# Patient Record
Sex: Female | Born: 1995 | Race: Black or African American | Hispanic: No | Marital: Single | Smoking: Never smoker
Health system: Southern US, Community
[De-identification: ages and names within clinical notes are randomized; demographics above are authoritative.]

## PROBLEM LIST (undated history)

## (undated) ENCOUNTER — Inpatient Hospital Stay (HOSPITAL_COMMUNITY): Payer: Self-pay

## (undated) DIAGNOSIS — Z2839 Other underimmunization status: Secondary | ICD-10-CM

## (undated) DIAGNOSIS — Z283 Underimmunization status: Secondary | ICD-10-CM

## (undated) DIAGNOSIS — O09899 Supervision of other high risk pregnancies, unspecified trimester: Secondary | ICD-10-CM

## (undated) HISTORY — PX: NO PAST SURGERIES: SHX2092

---
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
## 2015-12-04 ENCOUNTER — Emergency Department (HOSPITAL_COMMUNITY): Payer: Medicaid Other

## 2015-12-04 ENCOUNTER — Emergency Department (HOSPITAL_COMMUNITY)
Admission: EM | Admit: 2015-12-04 | Discharge: 2015-12-04 | Disposition: A | Discharge status: Home / Self Care | Payer: Medicaid Other | Attending: Emergency Medicine | Admitting: Emergency Medicine

## 2015-12-04 ENCOUNTER — Encounter (HOSPITAL_COMMUNITY): Payer: Self-pay | Admitting: *Deleted

## 2015-12-04 ENCOUNTER — Other Ambulatory Visit: Payer: Self-pay

## 2015-12-04 DIAGNOSIS — Z3A14 14 weeks gestation of pregnancy: Secondary | ICD-10-CM | POA: Insufficient documentation

## 2015-12-04 DIAGNOSIS — O99411 Diseases of the circulatory system complicating pregnancy, first trimester: Secondary | ICD-10-CM | POA: Diagnosis present

## 2015-12-04 DIAGNOSIS — R0789 Other chest pain: Secondary | ICD-10-CM | POA: Diagnosis not present

## 2015-12-04 DIAGNOSIS — O99511 Diseases of the respiratory system complicating pregnancy, first trimester: Secondary | ICD-10-CM | POA: Diagnosis not present

## 2015-12-04 DIAGNOSIS — J069 Acute upper respiratory infection, unspecified: Secondary | ICD-10-CM

## 2015-12-04 MED ORDER — AZITHROMYCIN 250 MG PO TABS: 250.0000 mg | ORAL_TABLET | Freq: Every day | ORAL | Status: DC

## 2015-12-04 MED ORDER — ALBUTEROL SULFATE HFA 108 (90 BASE) MCG/ACT IN AERS
2.0000 | INHALATION_SPRAY | RESPIRATORY_TRACT | Status: DC | PRN
  Administered 2015-12-04: 2 via RESPIRATORY_TRACT
  Filled 2015-12-04: qty 6.7

## 2015-12-04 NOTE — ED Notes (Signed)
Pt departed in NAD.  

## 2015-12-04 NOTE — Discharge Instructions (Signed)
Upper Respiratory Infection, Adult Most upper respiratory infections (URIs) are a viral infection of the air passages leading to the lungs. A URI affects the nose, throat, and upper air passages. The most common type of URI is nasopharyngitis and is typically referred to as "the common cold." URIs run their course and usually go away on their own. Most of the time, a URI does not require medical attention, but sometimes a bacterial infection in the upper airways can follow a viral infection. This is called a secondary infection. Sinus and middle ear infections are common types of secondary upper respiratory infections. Bacterial pneumonia can also complicate a URI. A URI can worsen asthma and chronic obstructive pulmonary disease (COPD). Sometimes, these complications can require emergency medical care and may be life threatening.  CAUSES Almost all URIs are caused by viruses. A virus is a type of germ and can spread from one person to another.  RISKS FACTORS You may be at risk for a URI if:   You smoke.   You have chronic heart or lung disease.  You have a weakened defense (immune) system.   You are very young or very old.   You have nasal allergies or asthma.  You work in crowded or poorly ventilated areas.  You work in health care facilities or schools. SIGNS AND SYMPTOMS  Symptoms typically develop 2-3 days after you come in contact with a cold virus. Most viral URIs last 7-10 days. However, viral URIs from the influenza virus (flu virus) can last 14-18 days and are typically more severe. Symptoms may include:   Runny or stuffy (congested) nose.   Sneezing.   Cough.   Sore throat.   Headache.   Fatigue.   Fever.   Loss of appetite.   Pain in your forehead, behind your eyes, and over your cheekbones (sinus pain).  Muscle aches.  DIAGNOSIS  Your health care provider may diagnose a URI by:  Physical exam.  Tests to check that your symptoms are not due to  another condition such as:  Strep throat.  Sinusitis.  Pneumonia.  Asthma. TREATMENT  A URI goes away on its own with time. It cannot be cured with medicines, but medicines may be prescribed or recommended to relieve symptoms. Medicines may help:  Reduce your fever.  Reduce your cough.  Relieve nasal congestion. HOME CARE INSTRUCTIONS   Take medicines only as directed by your health care provider.   Gargle warm saltwater or take cough drops to comfort your throat as directed by your health care provider.  Use a warm mist humidifier or inhale steam from a shower to increase air moisture. This may make it easier to breathe.  Drink enough fluid to keep your urine clear or pale yellow.   Eat soups and other clear broths and maintain good nutrition.   Rest as needed.   Return to work when your temperature has returned to normal or as your health care provider advises. You may need to stay home longer to avoid infecting others. You can also use a face mask and careful hand washing to prevent spread of the virus.  Increase the usage of your inhaler if you have asthma.   Do not use any tobacco products, including cigarettes, chewing tobacco, or electronic cigarettes. If you need help quitting, ask your health care provider. PREVENTION  The best way to protect yourself from getting a cold is to practice good hygiene.   Avoid oral or hand contact with people with cold   symptoms.   Wash your hands often if contact occurs.  There is no clear evidence that vitamin C, vitamin E, echinacea, or exercise reduces the chance of developing a cold. However, it is always recommended to get plenty of rest, exercise, and practice good nutrition.  SEEK MEDICAL CARE IF:   You are getting worse rather than better.   Your symptoms are not controlled by medicine.   You have chills.  You have worsening shortness of breath.  You have brown or red mucus.  You have yellow or brown nasal  discharge.  You have pain in your face, especially when you bend forward.  You have a fever.  You have swollen neck glands.  You have pain while swallowing.  You have white areas in the back of your throat. SEEK IMMEDIATE MEDICAL CARE IF:   You have severe or persistent:  Headache.  Ear pain.  Sinus pain.  Chest pain.  You have chronic lung disease and any of the following:  Wheezing.  Prolonged cough.  Coughing up blood.  A change in your usual mucus.  You have a stiff neck.  You have changes in your:  Vision.  Hearing.  Thinking.  Mood. MAKE SURE YOU:   Understand these instructions.  Will watch your condition.  Will get help right away if you are not doing well or get worse.   This information is not intended to replace advice given to you by your health care provider. Make sure you discuss any questions you have with your health care provider.   Document Released: 11/20/2000 Document Revised: 10/11/2014 Document Reviewed: 09/01/2013 Elsevier Interactive Patient Education 2016 Elsevier Inc.  

## 2015-12-04 NOTE — ED Notes (Signed)
The  Pt is c/o  A cold since this past Friday.  C/o chest pain when she coughs  Non-productive  lmp  March she is preg and is due dec 19th this is first baby

## 2015-12-04 NOTE — ED Provider Notes (Signed)
CSN: [id number]     Arrival date & time 12/04/15  0059 History   First MD Initiated Contact with Patient 12/04/15 0244     Chief Complaint  Patient presents with  . Chest Pain     (Consider location/radiation/quality/duration/timing/severity/associated sxs/prior Treatment) HPI Comments: Patient approximately 14 weeks into her first pregnancy presents with complaint of 7 days of cough, congestion, now with chest pain associated with cough. No known fever. She has tried taking Dayquil without relief. No abdominal pain, or vomiting.   The history is provided by the patient. No language interpreter was used.    History reviewed. No pertinent past medical history. History reviewed. No pertinent past surgical history. No family history on file. Social History  Substance Use Topics  . Smoking status: Never Smoker   . Smokeless tobacco: None  . Alcohol Use: No   OB History    Gravida Para Term Preterm AB TAB SAB Ectopic Multiple Living   1              Review of Systems  Constitutional: Negative for fever and chills.  HENT: Positive for congestion. Negative for sore throat.   Respiratory: Positive for cough. Negative for shortness of breath and wheezing.   Cardiovascular: Positive for chest pain.  Gastrointestinal: Negative.  Negative for vomiting and abdominal pain.  Musculoskeletal: Negative.  Negative for myalgias.  Skin: Negative.  Negative for rash.  Neurological: Negative.       Allergies  Review of patient's allergies indicates no known allergies.  Home Medications   Prior to Admission medications   Not on File   BP 124/73 mmHg  Pulse 61  Temp(Src) 98.5 F (36.9 C) (Oral)  Resp 17  Ht 5\' 9"  (1.753 m)  Wt 81.874 kg  BMI 26.64 kg/m2  SpO2 99%  LMP 09/03/2015 Physical Exam  Constitutional: She is oriented to person, place, and time. She appears well-developed and well-nourished.  HENT:  Head: Normocephalic.  Neck: Normal range of motion. Neck supple.   Cardiovascular: Normal rate and regular rhythm.   Pulmonary/Chest: Effort normal and breath sounds normal. She has no wheezes. She has no rales. She exhibits tenderness.  Actively coughing, congested but non-productive cough.  Abdominal: Soft. Bowel sounds are normal. There is no tenderness. There is no rebound and no guarding.  Musculoskeletal: Normal range of motion.  Neurological: She is alert and oriented to person, place, and time.  Skin: Skin is warm and dry. No rash noted.  Psychiatric: She has a normal mood and affect.    ED Course  Procedures (including critical care time) Labs Review Labs Reviewed - No data to display  Imaging Review Dg Chest 2 View  12/04/2015  CLINICAL DATA:  20 year old female with cold and cough EXAM: CHEST  2 VIEW COMPARISON:  None. FINDINGS: The heart size and mediastinal contours are within normal limits. Both lungs are clear. The visualized skeletal structures are unremarkable. IMPRESSION: No active cardiopulmonary disease. Electronically Signed   By: Elgie CollardArash  Radparvar M.D.   On: 12/04/2015 02:07   I have personally reviewed and evaluated these images and lab results as part of my medical decision-making.   EKG Interpretation None      MDM   Final diagnoses:  None    1. URI  Patient with one week symptoms of URI, cough getting worse now with chest pain that is c/w chest wall pain. Will cover with Z-pack, Albuterol inhaler for cough. Encouraged PCP follow up for further management if symptoms persist.  Elpidio AnisShari Tyreisha Ungar, PA-C 12/04/15 [phone]  Azalia BilisKevin Campos, MD 12/04/15 [phone]

## 2016-01-18 LAB — OB RESULTS CONSOLE HEPATITIS B SURFACE ANTIGEN: Hepatitis B Surface Ag: NEGATIVE

## 2016-01-18 LAB — OB RESULTS CONSOLE HIV ANTIBODY (ROUTINE TESTING): HIV: NONREACTIVE

## 2016-01-18 LAB — OB RESULTS CONSOLE GC/CHLAMYDIA
Chlamydia: NEGATIVE
GC PROBE AMP, GENITAL: NEGATIVE

## 2016-01-18 LAB — OB RESULTS CONSOLE RUBELLA ANTIBODY, IGM: Rubella: IMMUNE

## 2016-01-18 LAB — OB RESULTS CONSOLE RPR: RPR: NONREACTIVE

## 2016-01-18 LAB — OB RESULTS CONSOLE ABO/RH: RH Type: POSITIVE

## 2016-05-24 ENCOUNTER — Encounter (HOSPITAL_COMMUNITY): Payer: Self-pay | Admitting: *Deleted

## 2016-05-24 ENCOUNTER — Inpatient Hospital Stay (HOSPITAL_COMMUNITY): Payer: Medicaid Other

## 2016-05-24 ENCOUNTER — Inpatient Hospital Stay (HOSPITAL_COMMUNITY)
Admission: AD | Admit: 2016-05-24 | Discharge: 2016-05-24 | Disposition: A | Discharge status: Home / Self Care | Payer: Medicaid Other | Source: Ambulatory Visit | Attending: Obstetrics and Gynecology | Admitting: Obstetrics and Gynecology

## 2016-05-24 DIAGNOSIS — O368130 Decreased fetal movements, third trimester, not applicable or unspecified: Secondary | ICD-10-CM

## 2016-05-24 DIAGNOSIS — R030 Elevated blood-pressure reading, without diagnosis of hypertension: Secondary | ICD-10-CM | POA: Diagnosis not present

## 2016-05-24 DIAGNOSIS — Z3A34 34 weeks gestation of pregnancy: Secondary | ICD-10-CM | POA: Diagnosis not present

## 2016-05-24 DIAGNOSIS — O26893 Other specified pregnancy related conditions, third trimester: Secondary | ICD-10-CM | POA: Diagnosis not present

## 2016-05-24 DIAGNOSIS — O288 Other abnormal findings on antenatal screening of mother: Secondary | ICD-10-CM | POA: Insufficient documentation

## 2016-05-24 HISTORY — DX: Underimmunization status: Z28.3

## 2016-05-24 HISTORY — DX: Other underimmunization status: Z28.39

## 2016-05-24 HISTORY — DX: Supervision of other high risk pregnancies, unspecified trimester: O09.899

## 2016-05-24 LAB — COMPREHENSIVE METABOLIC PANEL
ALBUMIN: 2.9 g/dL — AB (ref 3.5–5.0)
ALT: 11 U/L — ABNORMAL LOW (ref 14–54)
ANION GAP: 8 (ref 5–15)
AST: 19 U/L (ref 15–41)
Alkaline Phosphatase: 98 U/L (ref 38–126)
BUN: 5 mg/dL — ABNORMAL LOW (ref 6–20)
CHLORIDE: 104 mmol/L (ref 101–111)
CO2: 23 mmol/L (ref 22–32)
Calcium: 8.9 mg/dL (ref 8.9–10.3)
Creatinine, Ser: 0.48 mg/dL (ref 0.44–1.00)
GFR calc Af Amer: >60 mL/min (ref >60)
GFR calc non Af Amer: >60 mL/min (ref >60)
GLUCOSE: 78 mg/dL (ref 65–99)
POTASSIUM: 3.5 mmol/L (ref 3.5–5.1)
SODIUM: 135 mmol/L (ref 135–145)
Total Bilirubin: 0.4 mg/dL (ref 0.3–1.2)
Total Protein: 7.3 g/dL (ref 6.5–8.1)

## 2016-05-24 LAB — PROTEIN / CREATININE RATIO, URINE
Creatinine, Urine: 28 mg/dL
Total Protein, Urine: <6 mg/dL

## 2016-05-24 LAB — CBC
HCT: 32.6 % — ABNORMAL LOW (ref 36.0–46.0)
Hemoglobin: 11.8 g/dL — ABNORMAL LOW (ref 12.0–15.0)
MCH: 28.1 pg (ref 26.0–34.0)
MCHC: 36.2 g/dL — AB (ref 30.0–36.0)
MCV: 77.6 fL — ABNORMAL LOW (ref 78.0–100.0)
Platelets: 236 10*3/uL (ref 150–400)
RBC: 4.2 MIL/uL (ref 3.87–5.11)
RDW: 14.2 % (ref 11.5–15.5)
WBC: 6.2 10*3/uL (ref 4.0–10.5)

## 2016-05-24 NOTE — Discharge Instructions (Signed)
Hypertension During Pregnancy °Hypertension, commonly called high blood pressure, is when the force of blood pumping through your arteries is too strong. Arteries are blood vessels that carry blood from the heart throughout the body. Hypertension during pregnancy can cause problems for you and your baby. Your baby may be born early (prematurely) or may not weigh as much as he or she should at birth. Very bad cases of hypertension during pregnancy can be life-threatening. °Different types of hypertension can occur during pregnancy. These include: °· Chronic hypertension. This happens when: °¨ You have hypertension before pregnancy and it continues during pregnancy. °¨ You develop hypertension before you are [redacted] weeks pregnant, and it continues during pregnancy. °· Gestational hypertension. This is hypertension that develops after the 20th week of pregnancy. °· Preeclampsia, also called toxemia of pregnancy. This is a very serious type of hypertension that develops only during pregnancy. It affects the whole body, and it can be very dangerous for you and your baby. °Gestational hypertension and preeclampsia usually go away within 6 weeks after your baby is born. Women who have hypertension during pregnancy have a greater chance of developing hypertension later in life or during future pregnancies. °What are the causes? °The exact cause of hypertension is not known. °What increases the risk? °There are certain factors that make it more likely for you to develop hypertension during pregnancy. These include: °· Having hypertension during a previous pregnancy or prior to pregnancy. °· Being overweight. °· Being older than age 40. °· Being pregnant for the first time or being pregnant with more than one baby. °· Becoming pregnant using fertilization methods such as IVF (in vitro fertilization). °· Having diabetes, kidney problems, or systemic lupus erythematosus. °· Having a family history of hypertension. °What are the  signs or symptoms? °Chronic hypertension and gestational hypertension rarely cause symptoms. Preeclampsia causes symptoms, which may include: °· Increased protein in your urine. Your health care provider will check for this at every visit before you give birth (prenatal visit). °· Severe headaches. °· Sudden weight gain. °· Swelling of the hands, face, legs, and feet. °· Nausea and vomiting. °· Vision problems, such as blurred or double vision. °· Numbness in the face, arms, legs, and feet. °· Dizziness. °· Slurred speech. °· Sensitivity to bright lights. °· Abdominal pain. °· Convulsions. °How is this diagnosed? °You may be diagnosed with hypertension during a routine prenatal exam. At each prenatal visit, you may: °· Have a urine test to check for high amounts of protein in your urine. °· Have your blood pressure checked. A blood pressure reading is recorded as two numbers, such as "120 over 80" (or 120/80). The first ("top") number is called the systolic pressure. It is a measure of the pressure in your arteries when your heart beats. The second ("bottom") number is called the diastolic pressure. It is a measure of the pressure in your arteries as your heart relaxes between beats. Blood pressure is measured in a unit called mm Hg. A normal blood pressure reading is: °¨ Systolic: below 120. °¨ Diastolic: below 80. °The type of hypertension that you are diagnosed with depends on your test results and when your symptoms developed. °· Chronic hypertension is usually diagnosed before 20 weeks of pregnancy. °· Gestational hypertension is usually diagnosed after 20 weeks of pregnancy. °· Hypertension with high amounts of protein in the urine is diagnosed as preeclampsia. °· Blood pressure measurements that stay above 160 systolic, or above 110 diastolic, are signs of severe preeclampsia. °  How is this treated? Treatment for hypertension during pregnancy varies depending on the type of hypertension you have and how  serious it is.  If you take medicines called ACE inhibitors to treat chronic hypertension, you may need to switch medicines. ACE inhibitors should not be taken during pregnancy.  If you have gestational hypertension, you may need to take blood pressure medicine.  If you are at risk for preeclampsia, your health care provider may recommend that you take a low-dose aspirin every day to prevent high blood pressure during your pregnancy.  If you have severe preeclampsia, you may need to be hospitalized so you and your baby can be monitored closely. You may also need to take medicine (magnesium sulfate) to prevent seizures and to lower blood pressure. This medicine may be given as an injection or through an IV tube.  In some cases, if your condition gets worse, you may need to deliver your baby early. Follow these instructions at home: Eating and drinking  Drink enough fluid to keep your urine clear or pale yellow.  Eat a healthy diet that is low in salt (sodium). Do not add salt to your food. Check food labels to see how much sodium a food or beverage contains. Lifestyle  Do not use any products that contain nicotine or tobacco, such as cigarettes and e-cigarettes. If you need help quitting, ask your health care provider.  Do not use alcohol.  Avoid caffeine.  Avoid stress as much as possible. Rest and get plenty of sleep. General instructions  Take over-the-counter and prescription medicines only as told by your health care provider.  While lying down, lie on your left side. This keeps pressure off your baby.  While sitting or lying down, raise (elevate) your feet. Try putting some pillows under your lower legs.  Exercise regularly. Ask your health care provider what kinds of exercise are best for you.  Keep all prenatal and follow-up visits as told by your health care provider. This is important. Contact a health care provider if:  You have symptoms that your health care provider  told you may require more treatment or monitoring, such as:  Fever.  Vomiting.  Headache. Get help right away if:  You have severe abdominal pain or vomiting that does not get better with treatment.  You suddenly develop swelling in your hands, ankles, or face.  You gain 4 lbs (1.8 kg) or more in 1 week.  You develop vaginal bleeding, or you have blood in your urine.  You do not feel your baby moving as much as usual.  You have blurred or double vision.  You have muscle twitching or sudden tightening (spasms).  You have shortness of breath.  Your lips or fingernails turn blue. This information is not intended to replace advice given to you by your health care provider. Make sure you discuss any questions you have with your health care provider. Document Released: 02/12/2011 Document Revised: 12/15/2015 Document Reviewed: 11/10/2015 Elsevier Interactive Patient Education  2017 Elsevier Inc.    Introduction Patient Name: ________________________________________________ Patient Due Date: ____________________ What is a fetal movement count? A fetal movement count is the number of times that you feel your baby move during a certain amount of time. This may also be called a fetal kick count. A fetal movement count is recommended for every pregnant woman. You may be asked to start counting fetal movements as early as week 28 of your pregnancy. Pay attention to when your baby is most active.  You may notice your baby's sleep and wake cycles. You may also notice things that make your baby move more. You should do a fetal movement count:  When your baby is normally most active.  At the same time each day. A good time to count movements is while you are resting, after having something to eat and drink. How do I count fetal movements? 1. Find a quiet, comfortable area. Sit, or lie down on your side. 2. Write down the date, the start time and stop time, and the number of movements that  you felt between those two times. Take this information with you to your health care visits. 3. For 2 hours, count kicks, flutters, swishes, rolls, and jabs. You should feel at least 10 movements during 2 hours. 4. You may stop counting after you have felt 10 movements. 5. If you do not feel 10 movements in 2 hours, have something to eat and drink. Then, keep resting and counting for 1 hour. If you feel at least 4 movements during that hour, you may stop counting. Contact a health care provider if:  You feel fewer than 4 movements in 2 hours.  Your baby is not moving like he or she usually does. Date: ____________ Start time: ____________ Stop time: ____________ Movements: ____________ Date: ____________ Start time: ____________ Stop time: ____________ Movements: ____________ Date: ____________ Start time: ____________ Stop time: ____________ Movements: ____________ Date: ____________ Start time: ____________ Stop time: ____________ Movements: ____________ Date: ____________ Start time: ____________ Stop time: ____________ Movements: ____________ Date: ____________ Start time: ____________ Stop time: ____________ Movements: ____________ Date: ____________ Start time: ____________ Stop time: ____________ Movements: ____________ Date: ____________ Start time: ____________ Stop time: ____________ Movements: ____________ Date: ____________ Start time: ____________ Stop time: ____________ Movements: ____________ This information is not intended to replace advice given to you by your health care provider. Make sure you discuss any questions you have with your health care provider. Document Released: 06/26/2006 Document Revised: 01/24/2016 Document Reviewed: 07/06/2015 Elsevier Interactive Patient Education  2017 [employer]

## 2016-05-24 NOTE — MAU Provider Note (Signed)
History     CSN: [id number]  Arrival date and time: 05/24/16 1341   First Provider Initiated Contact with Patient 05/24/16 1423      Chief Complaint  Patient presents with  . Decreased Fetal Movement   HPI Andrea Oneill is a 20 y.o. G1P0 at [id number] who [facility]. Patient went to office today with complaints of decreased fetal movement x 2 days. Reports feeling baby move while on the monitor in the office but none since then. Denies abdominal pain, abdominal trauma/fall, vaginal bleeding, or LOF.  Hx of elevated BPs in the office. Reports headache this morning that has since resolved without medication. Denies chest pain, epigastric pain, or n/v.   OB History    Gravida Para Term Preterm AB Living   1             SAB TAB Ectopic Multiple Live Births                  Past Medical History:  Diagnosis Date  . Maternal varicella, non-immune     Past Surgical History:  Procedure Laterality Date  . NO PAST SURGERIES      History reviewed. No pertinent family history.  Social History  Substance Use Topics  . Smoking status: Never Smoker  . Smokeless tobacco: Never Used  . Alcohol use No    Allergies: No Known Allergies  Prescriptions Prior to Admission  Medication Sig Dispense Refill Last Dose  . azithromycin (ZITHROMAX) 250 MG tablet Take 1 tablet (250 mg total) by mouth daily. Take first 2 tablets together, then 1 every day until finished. 6 tablet 0     Review of Systems  Constitutional: Negative.   Gastrointestinal: Negative.   Genitourinary: Negative.   Neurological: Positive for headaches (this AM; resolved).   Physical Exam   Blood pressure 135/84, pulse 88, temperature 99.2 F (37.3 C), resp. rate 18, last menstrual period 09/23/2015.  Temp:  [99.2 F (37.3 C)] 99.2 F (37.3 C) (12/15 1406) Pulse Rate:  [80-94] 80 (12/15 1653) Resp:  [18] 18 (12/15 1406) BP: (124-148)/(69-84) 135/75 (12/15 1653)  Physical Exam   Nursing note and vitals reviewed. Constitutional: She is oriented to person, place, and time. She appears well-developed and well-nourished. No distress.  HENT:  Head: Normocephalic and atraumatic.  Eyes: Conjunctivae are normal. Right eye exhibits no discharge. Left eye exhibits no discharge. No scleral icterus.  Neck: Normal range of motion.  Cardiovascular: Normal rate, regular rhythm and normal heart sounds.   No murmur heard. Respiratory: Effort normal and breath sounds normal. No respiratory distress. She has no wheezes.  GI: Soft. There is no tenderness.  Neurological: She is alert and oriented to person, place, and time. She has normal reflexes.  No clonus  Skin: Skin is warm and dry. She is not diaphoretic.  Psychiatric: She has a normal mood and affect. Her behavior is normal. Judgment and thought content normal.   Fetal Tracing:  Baseline: 135 Variability: moderate Accelerations: 15x15 Decelerations: none  Toco: none   MAU Course  Procedures Results for orders placed or performed during the hospital encounter of 05/24/16 (from the past 24 hour(s))  Protein / creatinine ratio, urine     Status: None   Collection Time: 05/24/16  1:58 PM  Result Value Ref Range   Creatinine, Urine 28.00 mg/dL   Total Protein, Urine <6 mg/dL   Protein Creatinine Ratio        0.00 - 0.15 mg/mg[Cre]  CBC     Status: Abnormal   Collection Time: 05/24/16  3:05 PM  Result Value Ref Range   WBC 6.2 4.0 - 10.5 K/uL   RBC 4.20 3.87 - 5.11 MIL/uL   Hemoglobin 11.8 (L) 12.0 - 15.0 g/dL   HCT 96.032.6 (L) 45.436.0 - 09.846.0 %   MCV 77.6 (L) 78.0 - 100.0 fL   MCH 28.1 26.0 - 34.0 pg   MCHC 36.2 (H) 30.0 - 36.0 g/dL   RDW 11.914.2 14.711.5 - 82.915.5 %   Platelets 236 150 - 400 K/uL  Comprehensive metabolic panel     Status: Abnormal   Collection Time: 05/24/16  3:05 PM  Result Value Ref Range   Sodium 135 135 - 145 mmol/L   Potassium 3.5 3.5 - 5.1 mmol/L   Chloride 104 101 - 111 mmol/L   CO2 23 22 - 32  mmol/L   Glucose, Bld 78 65 - 99 mg/dL   BUN 5 (L) 6 - 20 mg/dL   Creatinine, Ser 5.620.48 0.44 - 1.00 mg/dL   Calcium 8.9 8.9 - 13.010.3 mg/dL   Total Protein 7.3 6.5 - 8.1 g/dL   Albumin 2.9 (L) 3.5 - 5.0 g/dL   AST 19 15 - 41 U/L   ALT 11 (L) 14 - 54 U/L   Alkaline Phosphatase 98 38 - 126 U/L   Total Bilirubin 0.4 0.3 - 1.2 mg/dL   GFR calc non Af Amer >60 >60 mL/min   GFR calc Af Amer >60 >60 mL/min   Anion gap 8 5 - 15    MDM Reactive fetal tracing BPP 6/8, AFI 19 CBC, CMP, urine PCR Elevated BP x 2; none severe range Discussed pt with Dr. Vergie LivingPickens; Ok to discharge home & keep f/u in office as scheduled  Assessment and Plan  A: 1. Elevated BP without diagnosis of hypertension   2. Non-reactive NST (non-stress test)   3. [redacted] weeks gestation of pregnancy   4. Decreased fetal movement affecting management of pregnancy in third trimester, single or unspecified fetus    P: Discharge home Keep f/u with OB Discussed reasons to return to MAU including s/s pre eclampsia Fetal kick counts  Judeth HornErin Brianny Soulliere 05/24/2016, 2:23 PM

## 2016-05-24 NOTE — MAU Note (Signed)
Urine sent to lab 

## 2016-05-24 NOTE — MAU Note (Signed)
Pt sent from health dept for non reactive NST for prolonged monitoring

## 2016-05-30 LAB — OB RESULTS CONSOLE GBS: STREP GROUP B AG: NEGATIVE

## 2016-06-10 NOTE — L&D Delivery Note (Signed)
Delivery Note At 3:45 AM a viable female was delivered via Vaginal, Spontaneous Delivery (Presentation: vertex;OA).  APGAR: 7, 9; weight pending.   Placenta status: spontaneous,intact.  Cord: 3 vessel with the following complications: none.  Cord pH: not obtained.   Hemorrhage: Pitocin was started after baby's cord was clamped and cut. Upon delivery of placenta, patient's uterus felt boggy when massaged and she had a significant amount of bright red blood pouring out of vagina. Pitocin was increased. Patient continued to bleed with uterine massage. Postpartum hemorrhage protocol was initiated. Hemabate 250 mcg, Methergine 0.2 mg IM, as well as 800 mcg of buccal Cytotec was given. A 2nd IV line was started. Lomotil ordered. Maintenance IVF were started. Patient's vaginal bleeding began to subside. The uterus was checked for clots by Dr. Despina HiddenEure. Methergine 0.2 mg q4 hours was ordered.   Anesthesia: Epidural Episiotomy: None Lacerations:  1st degree perineal Suture Repair: 3.0 vicryl Est. Blood Loss (mL):  2,000cc  Mom to be monitored on birthing suites before being transferred to postpartum.  Baby to Couplet care / Skin to Skin.  [name] 06/24/2016, 4:46 AM

## 2016-06-21 ENCOUNTER — Inpatient Hospital Stay (EMERGENCY_DEPARTMENT_HOSPITAL)
Admission: AD | Admit: 2016-06-21 | Discharge: 2016-06-22 | Disposition: A | Discharge status: Home / Self Care | Payer: Medicaid Other | Source: Ambulatory Visit | Attending: Obstetrics & Gynecology | Admitting: Obstetrics & Gynecology

## 2016-06-21 ENCOUNTER — Encounter (HOSPITAL_COMMUNITY): Payer: Self-pay

## 2016-06-21 DIAGNOSIS — O133 Gestational [pregnancy-induced] hypertension without significant proteinuria, third trimester: Secondary | ICD-10-CM

## 2016-06-21 DIAGNOSIS — Z3A38 38 weeks gestation of pregnancy: Secondary | ICD-10-CM

## 2016-06-21 LAB — URINALYSIS, ROUTINE W REFLEX MICROSCOPIC
Bilirubin Urine: NEGATIVE
GLUCOSE, UA: NEGATIVE mg/dL
KETONES UR: NEGATIVE mg/dL
NITRITE: NEGATIVE
PH: 6 (ref 5.0–8.0)
Protein, ur: 30 mg/dL — AB
Specific Gravity, Urine: 1.023 (ref 1.005–1.030)

## 2016-06-21 LAB — PROTEIN / CREATININE RATIO, URINE
Creatinine, Urine: 225 mg/dL
PROTEIN CREATININE RATIO: 0.12 mg/mg{creat} (ref 0.00–0.15)
Total Protein, Urine: 28 mg/dL

## 2016-06-21 LAB — COMPREHENSIVE METABOLIC PANEL
ALT: 11 U/L — AB (ref 14–54)
AST: 19 U/L (ref 15–41)
Albumin: 2.8 g/dL — ABNORMAL LOW (ref 3.5–5.0)
Alkaline Phosphatase: 107 U/L (ref 38–126)
Anion gap: 8 (ref 5–15)
BILIRUBIN TOTAL: 0.5 mg/dL (ref 0.3–1.2)
BUN: 11 mg/dL (ref 6–20)
CHLORIDE: 104 mmol/L (ref 101–111)
CO2: 25 mmol/L (ref 22–32)
CREATININE: 0.54 mg/dL (ref 0.44–1.00)
Calcium: 8.8 mg/dL — ABNORMAL LOW (ref 8.9–10.3)
GFR calc Af Amer: >60 mL/min (ref >60)
Glucose, Bld: 105 mg/dL — ABNORMAL HIGH (ref 65–99)
Potassium: 4 mmol/L (ref 3.5–5.1)
Sodium: 137 mmol/L (ref 135–145)
TOTAL PROTEIN: 6.7 g/dL (ref 6.5–8.1)

## 2016-06-21 LAB — CBC WITH DIFFERENTIAL/PLATELET
BASOS ABS: 0 10*3/uL (ref 0.0–0.1)
Basophils Relative: 0 %
Eosinophils Absolute: 0.1 10*3/uL (ref 0.0–0.7)
Eosinophils Relative: 2 %
HEMATOCRIT: 31.1 % — AB (ref 36.0–46.0)
HEMOGLOBIN: 11.3 g/dL — AB (ref 12.0–15.0)
LYMPHS PCT: 25 %
Lymphs Abs: 1.9 10*3/uL (ref 0.7–4.0)
MCH: 28.1 pg (ref 26.0–34.0)
MCHC: 36.3 g/dL — ABNORMAL HIGH (ref 30.0–36.0)
MCV: 77.4 fL — AB (ref 78.0–100.0)
Monocytes Absolute: 0.3 10*3/uL (ref 0.1–1.0)
Monocytes Relative: 4 %
NEUTROS ABS: 5.4 10*3/uL (ref 1.7–7.7)
Neutrophils Relative %: 69 %
Platelets: 272 10*3/uL (ref 150–400)
RBC: 4.02 MIL/uL (ref 3.87–5.11)
RDW: 14.2 % (ref 11.5–15.5)
WBC: 7.7 10*3/uL (ref 4.0–10.5)

## 2016-06-21 MED ORDER — ZOLPIDEM TARTRATE 5 MG PO TABS
5.0000 mg | ORAL_TABLET | Freq: Once | ORAL | Status: AC
  Administered 2016-06-21: 5 mg via ORAL
  Filled 2016-06-21: qty 1

## 2016-06-21 MED ORDER — OXYCODONE-ACETAMINOPHEN 5-325 MG PO TABS
1.0000 | ORAL_TABLET | Freq: Once | ORAL | Status: AC
  Administered 2016-06-21: 1 via ORAL
  Filled 2016-06-21: qty 1

## 2016-06-21 NOTE — MAU Note (Signed)
Contractions since 1200. Stronger now. Some pink vag d/c.

## 2016-06-21 NOTE — MAU Provider Note (Signed)
History     CSN: [id number]  Arrival date and time: 06/21/16 2007   None     Chief Complaint  Patient presents with  . Contractions   HPI Patient is a 21 y.o. G1P0 at 2930w6d presented today for abdominal pain. She states that she is having crampy intermittent abdominal pain all day. She states that her abdominal pain feels like "period cramping". In the morning it wasn't that bad but now she is cramping every 5 minutes. In triage, patient's BP was noted to be 140/83. Patient states that she has had some elevated BPs in the past but does not have a diagnosis of hypertension. Denies any headaches or edema. Admits to some spots in her vision earlier today which is not normal for her. Admits to some light pink vaginal discharge today but no frank blood. Positive fetal movement.   OB History    Gravida Para Term Preterm AB Living   1             SAB TAB Ectopic Multiple Live Births                  Past Medical History:  Diagnosis Date  . Maternal varicella, non-immune     Past Surgical History:  Procedure Laterality Date  . NO PAST SURGERIES      History reviewed. No pertinent family history.  Social History  Substance Use Topics  . Smoking status: Never Smoker  . Smokeless tobacco: Never Used  . Alcohol use No    Allergies: No Known Allergies  Prescriptions Prior to Admission  Medication Sig Dispense Refill Last Dose  . Prenatal Vit-Fe Fumarate-FA (PNV PRENATAL PLUS MULTIVITAMIN) 27-1 MG TABS Take 1 tablet by mouth daily.  1 06/21/2016 at Unknown time    Review of Systems  Constitutional: Negative for fever.  HENT: Negative for sore throat.   Respiratory: Negative for cough, chest tightness, shortness of breath and wheezing.   Cardiovascular: Negative for chest pain and palpitations.  Gastrointestinal: Positive for abdominal pain. Negative for abdominal distention, constipation, diarrhea, nausea and vomiting.  Genitourinary: Negative for dysuria.  Musculoskeletal:  Negative for back pain.  Neurological: Negative for weakness and headaches.   Physical Exam   Blood pressure 132/72, pulse 93, temperature 98.4 F (36.9 C), resp. rate 18, height 5\' 8"  (1.727 m), weight 104.6 kg (230 lb 9.6 oz), last menstrual period 09/23/2015, SpO2 99 %.  Physical Exam  Constitutional: She is oriented to person, place, and time. She appears well-developed and well-nourished.  HENT:  Head: Normocephalic and atraumatic.  Eyes: Pupils are equal, round, and reactive to light.  Neck: Normal range of motion.  Cardiovascular: Normal rate, normal heart sounds and intact distal pulses.   Respiratory: Effort normal. No respiratory distress.  GI: Bowel sounds are normal. She exhibits distension (gravid abdomen). There is no tenderness.  Musculoskeletal: Normal range of motion. She exhibits no edema.  Neurological: She is alert and oriented to person, place, and time. She exhibits normal muscle tone.  Skin: Skin is warm and dry. No rash noted.  Psychiatric: She has a normal mood and affect. Her behavior is normal. Thought content normal.   MAU Course  Procedures  MDM  Vitals showing mildly elevated BP of 140/83, then went down to 130's, then back up to 140/80. Patient in pain with occasional contractions.  Physical exam unremarkable with the exception of a gravid abdomen. Cervical exams showing no change when checked 1 hour apart (1/80/-1) FHT reactive 130 bpm,  moderate variability with good acceleration, no decels Toco showing occasional UC and UI CBC showing normal platelets, CMP showing low normal LFTs, Urine P/C within normal limits, no signs of PreE Discussed patient with Dr. Despina Hidden due to new diagnosis of gHTN (she has had mildly elevated BP on 12/15 per Epic review). Per Dr. Despina Hidden patient ok for DC with [facility]  Assessment and Plan   Gestational Hypertension: No signs of Preeclampsia. No severe range pressures.   - Follow up with OB  - Discussed signs of symptoms of PreE and reasons to return to the MAU - Discharge home  Beaulah Dinning 06/21/2016, 9:40 PM   CNM attestation:  I have seen and examined this patient; I agree with above documentation in the resident's note. Pt discussed with Dr Despina Hidden for plan of care.  DELTA PICHON is a 21 y.o. G1P0 reporting cramping at regular intervals +FM, denies LOF, VB, vaginal discharge.  PE: BP 138/86 (BP Location: Left Arm)   Pulse 81   Temp 97.7 F (36.5 C) (Axillary)   Resp 16   Ht 5\' 8"  (1.727 m)   Wt 104.6 kg (230 lb 9.6 oz)   LMP 09/23/2015   SpO2 99%   BMI 35.06 kg/m  Gen: calm comfortable, NAD Resp: normal effort, no distress Abd: gravid; tight w/ ctx but less so in between  ROS, labs, PMH reviewed NST reactive, Cat 1; ctx seem to be q 3-6 mins  Plan: -   labor precautions rev'd - discussed s/s of pre-e - continue routine follow up in OB clinic on Tues for BP check -given Percocet and Ambien prior to d/c for rest in latent labor  Cam Hai, CNM 1:03 AM  06/22/2016

## 2016-06-22 ENCOUNTER — Encounter (HOSPITAL_COMMUNITY): Payer: Self-pay | Admitting: *Deleted

## 2016-06-22 ENCOUNTER — Inpatient Hospital Stay (HOSPITAL_COMMUNITY)
Admission: AD | Admit: 2016-06-22 | Discharge: 2016-06-26 | DRG: 774 | Disposition: A | Discharge status: Home / Self Care | Payer: Medicaid Other | Source: Ambulatory Visit | Attending: Family Medicine | Admitting: Family Medicine

## 2016-06-22 ENCOUNTER — Inpatient Hospital Stay (HOSPITAL_COMMUNITY)
Admission: AD | Admit: 2016-06-22 | Discharge: 2016-06-22 | Disposition: A | Discharge status: Home / Self Care | Payer: Medicaid Other | Source: Ambulatory Visit | Attending: Obstetrics & Gynecology | Admitting: Obstetrics & Gynecology

## 2016-06-22 DIAGNOSIS — O133 Gestational [pregnancy-induced] hypertension without significant proteinuria, third trimester: Secondary | ICD-10-CM

## 2016-06-22 DIAGNOSIS — Z3A39 39 weeks gestation of pregnancy: Secondary | ICD-10-CM

## 2016-06-22 DIAGNOSIS — O139 Gestational [pregnancy-induced] hypertension without significant proteinuria, unspecified trimester: Secondary | ICD-10-CM | POA: Diagnosis present

## 2016-06-22 DIAGNOSIS — Z3493 Encounter for supervision of normal pregnancy, unspecified, third trimester: Secondary | ICD-10-CM | POA: Diagnosis present

## 2016-06-22 DIAGNOSIS — O134 Gestational [pregnancy-induced] hypertension without significant proteinuria, complicating childbirth: Secondary | ICD-10-CM | POA: Diagnosis present

## 2016-06-22 LAB — CBC
HEMATOCRIT: 32.6 % — AB (ref 36.0–46.0)
HEMOGLOBIN: 11.7 g/dL — AB (ref 12.0–15.0)
MCH: 27.7 pg (ref 26.0–34.0)
MCHC: 35.9 g/dL (ref 30.0–36.0)
MCV: 77.3 fL — ABNORMAL LOW (ref 78.0–100.0)
Platelets: 265 10*3/uL (ref 150–400)
RBC: 4.22 MIL/uL (ref 3.87–5.11)
RDW: 14.2 % (ref 11.5–15.5)
WBC: 8.6 10*3/uL (ref 4.0–10.5)

## 2016-06-22 LAB — CBC WITH DIFFERENTIAL/PLATELET
Basophils Absolute: 0 10*3/uL (ref 0.0–0.1)
Basophils Relative: 0 %
EOS ABS: 0 10*3/uL (ref 0.0–0.7)
EOS PCT: 0 %
HCT: 33.2 % — ABNORMAL LOW (ref 36.0–46.0)
Hemoglobin: 11.9 g/dL — ABNORMAL LOW (ref 12.0–15.0)
LYMPHS ABS: 1.6 10*3/uL (ref 0.7–4.0)
LYMPHS PCT: 19 %
MCH: 27.7 pg (ref 26.0–34.0)
MCHC: 35.8 g/dL (ref 30.0–36.0)
MCV: 77.2 fL — ABNORMAL LOW (ref 78.0–100.0)
Monocytes Absolute: 0.3 10*3/uL (ref 0.1–1.0)
Monocytes Relative: 4 %
Neutro Abs: 6.6 10*3/uL (ref 1.7–7.7)
Neutrophils Relative %: 77 %
PLATELETS: 275 10*3/uL (ref 150–400)
RBC: 4.3 MIL/uL (ref 3.87–5.11)
RDW: 14.1 % (ref 11.5–15.5)
WBC: 8.6 10*3/uL (ref 4.0–10.5)

## 2016-06-22 LAB — COMPREHENSIVE METABOLIC PANEL
ALK PHOS: 112 U/L (ref 38–126)
ALT: 11 U/L — AB (ref 14–54)
ANION GAP: 8 (ref 5–15)
AST: 21 U/L (ref 15–41)
Albumin: 3 g/dL — ABNORMAL LOW (ref 3.5–5.0)
BUN: 6 mg/dL (ref 6–20)
CALCIUM: 9.3 mg/dL (ref 8.9–10.3)
CHLORIDE: 103 mmol/L (ref 101–111)
CO2: 26 mmol/L (ref 22–32)
CREATININE: 0.57 mg/dL (ref 0.44–1.00)
GFR calc non Af Amer: >60 mL/min (ref >60)
Glucose, Bld: 82 mg/dL (ref 65–99)
Potassium: 3.8 mmol/L (ref 3.5–5.1)
SODIUM: 137 mmol/L (ref 135–145)
Total Bilirubin: 1.1 mg/dL (ref 0.3–1.2)
Total Protein: 7.3 g/dL (ref 6.5–8.1)

## 2016-06-22 LAB — PROTEIN / CREATININE RATIO, URINE
CREATININE, URINE: 95 mg/dL
PROTEIN CREATININE RATIO: 0.13 mg/mg{creat} (ref 0.00–0.15)
TOTAL PROTEIN, URINE: 12 mg/dL

## 2016-06-22 MED ORDER — OXYTOCIN 40 UNITS IN LACTATED RINGERS INFUSION - SIMPLE MED
1.0000 m[IU]/min | INTRAVENOUS | Status: DC
  Administered 2016-06-23: 2 m[IU]/min via INTRAVENOUS
  Filled 2016-06-22: qty 1000

## 2016-06-22 MED ORDER — OXYCODONE-ACETAMINOPHEN 5-325 MG PO TABS: 2.0000 | ORAL_TABLET | ORAL | Status: DC | PRN

## 2016-06-22 MED ORDER — SOD CITRATE-CITRIC ACID 500-334 MG/5ML PO SOLN: 30.0000 mL | ORAL | Status: DC | PRN

## 2016-06-22 MED ORDER — LIDOCAINE HCL (PF) 1 % IJ SOLN: 30.0000 mL | INTRAMUSCULAR | Status: DC | PRN

## 2016-06-22 MED ORDER — OXYCODONE-ACETAMINOPHEN 5-325 MG PO TABS: 1.0000 | ORAL_TABLET | ORAL | Status: DC | PRN

## 2016-06-22 MED ORDER — OXYTOCIN 40 UNITS IN LACTATED RINGERS INFUSION - SIMPLE MED
2.5000 [IU]/h | INTRAVENOUS | Status: DC
Start: 2016-06-22 — End: 2016-06-23

## 2016-06-22 MED ORDER — TERBUTALINE SULFATE 1 MG/ML IJ SOLN: 0.2500 mg | Freq: Once | INTRAMUSCULAR | Status: DC | PRN

## 2016-06-22 MED ORDER — FENTANYL CITRATE (PF) 100 MCG/2ML IJ SOLN
100.0000 ug | INTRAMUSCULAR | Status: DC | PRN
  Administered 2016-06-23 (×4): 100 ug via INTRAVENOUS
  Filled 2016-06-22 (×4): qty 2

## 2016-06-22 MED ORDER — LACTATED RINGERS IV SOLN
INTRAVENOUS | Status: DC
  Administered 2016-06-23: 125 mL/h via INTRAVENOUS
  Administered 2016-06-23: 16:00:00 via INTRAVENOUS

## 2016-06-22 MED ORDER — ACETAMINOPHEN 325 MG PO TABS: 650.0000 mg | ORAL_TABLET | ORAL | Status: DC | PRN

## 2016-06-22 MED ORDER — LACTATED RINGERS IV SOLN
500.0000 mL | INTRAVENOUS | Status: DC | PRN
  Administered 2016-06-23: 1000 mL via INTRAVENOUS
  Administered 2016-06-23: 200 mL via INTRAVENOUS
  Administered 2016-06-23: 500 mL via INTRAVENOUS

## 2016-06-22 MED ORDER — OXYTOCIN BOLUS FROM INFUSION: 500.0000 mL | Freq: Once | INTRAVENOUS | Status: DC

## 2016-06-22 MED ORDER — MORPHINE SULFATE (PF) 10 MG/ML IV SOLN
5.0000 mg | Freq: Once | INTRAVENOUS | Status: AC
  Administered 2016-06-22: 5 mg via INTRAMUSCULAR
  Filled 2016-06-22: qty 1

## 2016-06-22 MED ORDER — MORPHINE SULFATE (PF) 10 MG/ML IV SOLN
5.0000 mg | Freq: Once | INTRAVENOUS | Status: AC
  Administered 2016-06-22: 5 mg via SUBCUTANEOUS
  Filled 2016-06-22: qty 1

## 2016-06-22 MED ORDER — ONDANSETRON HCL 4 MG/2ML IJ SOLN: 4.0000 mg | Freq: Four times a day (QID) | INTRAMUSCULAR | Status: DC | PRN

## 2016-06-22 MED ORDER — FLEET ENEMA 7-19 GM/118ML RE ENEM: 1.0000 | ENEMA | RECTAL | Status: DC | PRN

## 2016-06-22 NOTE — Progress Notes (Signed)
Dr Shawnie PonsPratt notified of pts presenting complaints, gestational age, BP's.  Orders for Morphine 5 MG SQ, 5 MG IM,  CMC, Protein/Creatine Ratio and CMP

## 2016-06-22 NOTE — MAU Note (Signed)
Contractions all day. Denies Leaking of fluid or vaginal bleeding

## 2016-06-22 NOTE — MAU Note (Signed)
For [phone]: recently discharged from MAU, contractions stronger. No leaking or bleeding.

## 2016-06-22 NOTE — Discharge Instructions (Signed)
Third Trimester of Pregnancy °The third trimester is from week 29 through week 42, months 7 through 9. This trimester is when your unborn baby (fetus) is growing very fast. At the end of the ninth month, the unborn baby is about 20 inches in length. It weighs about 6-10 pounds. °Follow these instructions at home: °· Avoid all smoking, herbs, and alcohol. Avoid drugs not approved by your doctor. °· Do not use any tobacco products, including cigarettes, chewing tobacco, and electronic cigarettes. If you need help quitting, ask your doctor. You may get counseling or other support to help you quit. °· Only take medicine as told by your doctor. Some medicines are safe and some are not during pregnancy. °· Exercise only as told by your doctor. Stop exercising if you start having cramps. °· Eat regular, healthy meals. °· Wear a good support bra if your breasts are tender. °· Do not use hot tubs, steam rooms, or saunas. °· Wear your seat belt when driving. °· Avoid raw meat, uncooked cheese, and liter boxes and soil used by cats. °· Take your prenatal vitamins. °· Take 1500-2000 milligrams of calcium daily starting at the 20th week of pregnancy until you deliver your baby. °· Try taking medicine that helps you poop (stool softener) as needed, and if your doctor approves. Eat more fiber by eating fresh fruit, vegetables, and whole grains. Drink enough fluids to keep your pee (urine) clear or pale yellow. °· Take warm water baths (sitz baths) to soothe pain or discomfort caused by hemorrhoids. Use hemorrhoid cream if your doctor approves. °· If you have puffy, bulging veins (varicose veins), wear support hose. Raise (elevate) your feet for 15 minutes, 3-4 times a day. Limit salt in your diet. °· Avoid heavy lifting, wear low heels, and sit up straight. °· Rest with your legs raised if you have leg cramps or low back pain. °· Visit your dentist if you have not gone during your pregnancy. Use a soft toothbrush to brush your  teeth. Be gentle when you floss. °· You can have sex (intercourse) unless your doctor tells you not to. °· Do not travel far distances unless you must. Only do so with your doctor's approval. °· Take prenatal classes. °· Practice driving to the hospital. °· Pack your hospital bag. °· Prepare the baby's room. °· Go to your doctor visits. °Get help if: °· You are not sure if you are in labor or if your water has broken. °· You are dizzy. °· You have mild cramps or pressure in your lower belly (abdominal). °· You have a nagging pain in your belly area. °· You continue to feel sick to your stomach (nauseous), throw up (vomit), or have watery poop (diarrhea). °· You have bad smelling fluid coming from your vagina. °· You have pain with peeing (urination). °Get help right away if: °· You have a fever. °· You are leaking fluid from your vagina. °· You are spotting or bleeding from your vagina. °· You have severe belly cramping or pain. °· You lose or gain weight rapidly. °· You have trouble catching your breath and have chest pain. °· You notice sudden or extreme puffiness (swelling) of your face, hands, ankles, feet, or legs. °· You have not felt the baby move in over an hour. °· You have severe headaches that do not go away with medicine. °· You have vision changes. °This information is not intended to replace advice given to you by your health care provider. Make   sure you discuss any questions you have with your health care provider. Document Released: 08/21/2009 Document Revised: 11/02/2015 Document Reviewed: 07/28/2012 Elsevier Interactive Patient Education  2017 [employer] Introduction Patient Name: ________________________________________________ Patient Due Date: ____________________ What is a fetal movement count? A fetal movement count is the number of times that you feel your baby move during a certain amount of time. This may also be called a fetal kick count. A fetal movement count is recommended for  every pregnant woman. You may be asked to start counting fetal movements as early as week 28 of your pregnancy. Pay attention to when your baby is most active. You may notice your baby's sleep and wake cycles. You may also notice things that make your baby move more. You should do a fetal movement count:  When your baby is normally most active.  At the same time each day. A good time to count movements is while you are resting, after having something to eat and drink. How do I count fetal movements? 1. Find a quiet, comfortable area. Sit, or lie down on your side. 2. Write down the date, the start time and stop time, and the number of movements that you felt between those two times. Take this information with you to your health care visits. 3. For 2 hours, count kicks, flutters, swishes, rolls, and jabs. You should feel at least 10 movements during 2 hours. 4. You may stop counting after you have felt 10 movements. 5. If you do not feel 10 movements in 2 hours, have something to eat and drink. Then, keep resting and counting for 1 hour. If you feel at least 4 movements during that hour, you may stop counting. Contact a health care provider if:  You feel fewer than 4 movements in 2 hours.  Your baby is not moving like he or she usually does. Date: ____________ Start time: ____________ Stop time: ____________ Movements: ____________ Date: ____________ Start time: ____________ Stop time: ____________ Movements: ____________ Date: ____________ Start time: ____________ Stop time: ____________ Movements: ____________ Date: ____________ Start time: ____________ Stop time: ____________ Movements: ____________ Date: ____________ Start time: ____________ Stop time: ____________ Movements: ____________ Date: ____________ Start time: ____________ Stop time: ____________ Movements: ____________ Date: ____________ Start time: ____________ Stop time: ____________ Movements: ____________ Date: ____________  Start time: ____________ Stop time: ____________ Movements: ____________ Date: ____________ Start time: ____________ Stop time: ____________ Movements: ____________ This information is not intended to replace advice given to you by your health care provider. Make sure you discuss any questions you have with your health care provider. Document Released: 06/26/2006 Document Revised: 01/24/2016 Document Reviewed: 07/06/2015 Elsevier Interactive Patient Education  2017 [employer] Hypertension During Pregnancy Hypertension is also called high blood pressure. High blood pressure means that the force of your blood moving in your body is too strong. When you are pregnant, this condition should be watched carefully. It can cause problems for you and your baby. Follow these instructions at home: Eating and drinking  Drink enough fluid to keep your pee (urine) clear or pale yellow.  Eat healthy foods that are low in salt (sodium).  Do not add salt to your food.  Check labels on foods and drinks to see much salt is in them. Look on the label where you see "Sodium." Lifestyle  Do not use any products that contain nicotine or tobacco, such as cigarettes and e-cigarettes. If you need help quitting, ask your doctor.  Do not use alcohol.  Avoid caffeine.  Avoid  stress. Rest and get plenty of sleep. General instructions  Take over-the-counter and prescription medicines only as told by your doctor.  While lying down, lie on your left side. This keeps pressure off your baby.  While sitting or lying down, raise (elevate) your feet. Try putting some pillows under your lower legs.  Exercise regularly. Ask your doctor what kinds of exercise are best for you.  Keep all prenatal and follow-up visits as told by your doctor. This is important. Contact a doctor if:  You have symptoms that your doctor told you to watch for, such as:  Fever.  Throwing up (vomiting).  Headache. Get help right away  if:  You have very bad pain in your belly (abdomen).  You are throwing up, and this does not get better with treatment.  You suddenly get swelling in your hands, ankles, or face.  You gain 4 lb (1.8 kg) or more in 1 week.  You get bleeding from your vagina.  You have blood in your pee.  You do not feel your baby moving as much as normal.  You have a change in vision.  You have muscle twitching or sudden tightening (spasms).  You have trouble breathing.  Your lips or fingernails turn blue. This information is not intended to replace advice given to you by your health care provider. Make sure you discuss any questions you have with your health care provider. Document Released: 06/29/2010 Document Revised: 02/06/2016 Document Reviewed: 02/06/2016 Elsevier Interactive Patient Education  2017 [employer] Inc[employer] Ball CorporationBraxton Hicks Contractions Contractions of the uterus can occur throughout pregnancy. Contractions are not always a sign that you are in labor.  WHAT ARE BRAXTON HICKS CONTRACTIONS?  Contractions that occur before labor are called Braxton Hicks contractions, or false labor. Toward the end of pregnancy (32-34 weeks), these contractions can develop more often and may become more forceful. This is not true labor because these contractions do not result in opening (dilatation) and thinning of the cervix. They are sometimes difficult to tell apart from true labor because these contractions can be forceful and people have different pain tolerances. You should not feel embarrassed if you go to the hospital with false labor. Sometimes, the only way to tell if you are in true labor is for your health care provider to look for changes in the cervix. If there are no prenatal problems or other health problems associated with the pregnancy, it is completely safe to be sent home with false labor and await the onset of true labor. HOW CAN YOU TELL THE DIFFERENCE BETWEEN TRUE AND FALSE LABOR? False  Labor   The contractions of false labor are usually shorter and not as hard as those of true labor.   The contractions are usually irregular.   The contractions are often felt in the front of the lower abdomen and in the groin.   The contractions may go away when you walk around or change positions while lying down.   The contractions get weaker and are shorter lasting as time goes on.   The contractions do not usually become progressively stronger, regular, and closer together as with true labor.  True Labor   Contractions in true labor last 30-70 seconds, become very regular, usually become more intense, and increase in frequency.   The contractions do not go away with walking.   The discomfort is usually felt in the top of the uterus and spreads to the lower abdomen and low back.   True labor can  be determined by your health care provider with an exam. This will show that the cervix is dilating and getting thinner.  WHAT TO REMEMBER  Keep up with your usual exercises and follow other instructions given by your health care provider.   Take medicines as directed by your health care provider.   Keep your regular prenatal appointments.   Eat and drink lightly if you think you are going into labor.   If Braxton Hicks contractions are making you uncomfortable:   Change your position from lying down or resting to walking, or from walking to resting.   Sit and rest in a tub of warm water.   Drink 2-3 glasses of water. Dehydration may cause these contractions.   Do slow and deep breathing several times an hour.  WHEN SHOULD I SEEK IMMEDIATE MEDICAL CARE? Seek immediate medical care if:  Your contractions become stronger, more regular, and closer together.   You have fluid leaking or gushing from your vagina.   You have a fever.   You pass blood-tinged mucus.   You have vaginal bleeding.   You have continuous abdominal pain.   You have low  back pain that you never had before.   You feel your baby's head pushing down and causing pelvic pressure.   Your baby is not moving as much as it used to.  This information is not intended to replace advice given to you by your health care provider. Make sure you discuss any questions you have with your health care provider. Document Released: 05/27/2005 Document Revised: 09/18/2015 Document Reviewed: 03/08/2013 Elsevier Interactive Patient Education  2017 [employer] Come to the MAU (maternity admission unit) for 1) Strong contractions every 2-3 minutes for at least 1 hour that do no go away when you drink water or take a warm shower. These contractions will be so strong all you can do is breath through them 2) Vaginal bleeding- anything more than spotting 3) Loss of fluid like you broke your water 4) Decreased movement of your baby 5) severe headacehe that doesn't go away with Tylenol

## 2016-06-22 NOTE — Discharge Instructions (Signed)
Braxton Hicks Contractions °Contractions of the uterus can occur throughout pregnancy. Contractions are not always a sign that you are in labor.  °WHAT ARE BRAXTON HICKS CONTRACTIONS?  °Contractions that occur before labor are called Braxton Hicks contractions, or false labor. Toward the end of pregnancy (32-34 weeks), these contractions can develop more often and may become more forceful. This is not true labor because these contractions do not result in opening (dilatation) and thinning of the cervix. They are sometimes difficult to tell apart from true labor because these contractions can be forceful and people have different pain tolerances. You should not feel embarrassed if you go to the hospital with false labor. Sometimes, the only way to tell if you are in true labor is for your health care provider to look for changes in the cervix. °If there are no prenatal problems or other health problems associated with the pregnancy, it is completely safe to be sent home with false labor and await the onset of true labor. °HOW CAN YOU TELL THE DIFFERENCE BETWEEN TRUE AND FALSE LABOR? °False Labor  °· The contractions of false labor are usually shorter and not as hard as those of true labor.   °· The contractions are usually irregular.   °· The contractions are often felt in the front of the lower abdomen and in the groin.   °· The contractions may go away when you walk around or change positions while lying down.   °· The contractions get weaker and are shorter lasting as time goes on.   °· The contractions do not usually become progressively stronger, regular, and closer together as with true labor.   °True Labor  °· Contractions in true labor last 30-70 seconds, become very regular, usually become more intense, and increase in frequency.   °· The contractions do not go away with walking.   °· The discomfort is usually felt in the top of the uterus and spreads to the lower abdomen and low back.   °· True labor can be  determined by your health care provider with an exam. This will show that the cervix is dilating and getting thinner.   °WHAT TO REMEMBER °· Keep up with your usual exercises and follow other instructions given by your health care provider.   °· Take medicines as directed by your health care provider.   °· Keep your regular prenatal appointments.   °· Eat and drink lightly if you think you are going into labor.   °· If Braxton Hicks contractions are making you uncomfortable:   °¨ Change your position from lying down or resting to walking, or from walking to resting.   °¨ Sit and rest in a tub of warm water.   °¨ Drink 2-3 glasses of water. Dehydration may cause these contractions.   °¨ Do slow and deep breathing several times an hour.   °WHEN SHOULD I SEEK IMMEDIATE MEDICAL CARE? °Seek immediate medical care if: °· Your contractions become stronger, more regular, and closer together.   °· You have fluid leaking or gushing from your vagina.   °· You have a fever.   °· You pass blood-tinged mucus.   °· You have vaginal bleeding.   °· You have continuous abdominal pain.   °· You have low back pain that you never had before.   °· You feel your baby's head pushing down and causing pelvic pressure.   °· Your baby is not moving as much as it used to.   °This information is not intended to replace advice given to you by your health care provider. Make sure you discuss any questions you have with your health care   provider. °Document Released: 05/27/2005 Document Revised: 09/18/2015 Document Reviewed: 03/08/2013 °Elsevier Interactive Patient Education © 2017 [employer] Inc[employer] ° °

## 2016-06-22 NOTE — H&P (Signed)
LABOR AND DELIVERY ADMISSION HISTORY AND PHYSICAL NOTE  Andrea Oneill is a 21 y.o. female G1P0 with IUP at [redacted]w[redacted]d by by 12wk 1 day [place] presenting for ongoing contractions. Patient has been having contractions for about 48 hours. She has been unable to sleep. Yesterday she had elevated BP in MAU, PIH labs were normal and Patient was D/C's home. On presentation she had elevated BP again.    She reports positive fetal movement. She denies leakage of fluid or vaginal bleeding.  Prenatal History/Complications:  Past Medical History: Past Medical History:  Diagnosis Date  . Maternal varicella, non-immune     Past Surgical History: Past Surgical History:  Procedure Laterality Date  . NO PAST SURGERIES      Obstetrical History: OB History    Gravida Para Term Preterm AB Living   1             SAB TAB Ectopic Multiple Live Births                  Social History: Social History   Social History  . Marital status: Single    Spouse name: N/A  . Number of children: N/A  . Years of education: N/A   Social History Main Topics  . Smoking status: Never Smoker  . Smokeless tobacco: Never Used  . Alcohol use No  . Drug use: No  . Sexual activity: Yes   Other Topics Concern  . None   Social History Narrative  . None    Family History: No family history on file.  Allergies: No Known Allergies  Prescriptions Prior to Admission  Medication Sig Dispense Refill Last Dose  . Prenatal Vit-Fe Fumarate-FA (PNV PRENATAL PLUS MULTIVITAMIN) 27-1 MG TABS Take 1 tablet by mouth daily.  1 06/21/2016 at Unknown time     Review of Systems   All systems reviewed and negative except as stated in HPI  Blood pressure 124/61, pulse 66, temperature 98 F (36.7 C), temperature source Oral, resp. rate 18, last menstrual period 09/23/2015. General appearance: alert, cooperative and appears stated age Lungs: clear to auscultation bilaterally Heart: regular rate and rhythm Abdomen: soft,  non-tender; bowel sounds normal Extremities: No calf swelling or tenderness Presentation: cephalic by nursing exam Fetal monitoring: category 1 Uterine activity: contractions every 3-4 mintues Dilation: 1.5 Effacement (%): 90 Station: -2 Exam by:: Dr Genevie Ann    Prenatal labs: ABO, Rh:  A+ Antibody:   neg Rubella: immune RPR:   neg HBsAg:   neg HIV:   neg GBS:   neg 1 hr Glucola: 105 Genetic screening:  Quad normal Anatomy US: normal  Prenatal Transfer Tool  Maternal Diabetes: No Genetic Screening: Normal Maternal Ultrasounds/Referrals: Normal Fetal Ultrasounds or other Referrals:  None Maternal Substance Abuse:  No Significant Maternal Medications:  None Significant Maternal Lab Results: Lab values include: Group B Strep negative  Results for orders placed or performed during the hospital encounter of 06/22/16 (from the past 24 hour(s))  Protein / creatinine ratio, urine   Collection Time: 06/22/16  8:15 PM  Result Value Ref Range   Creatinine, Urine 95.00 mg/dL   Total Protein, Urine 12 mg/dL   Protein Creatinine Ratio 0.13 0.00 - 0.15 mg/mg[Cre]  CBC with Differential   Collection Time: 06/22/16  8:19 PM  Result Value Ref Range   WBC 8.6 4.0 - 10.5 K/uL   RBC 4.30 3.87 - 5.11 MIL/uL   Hemoglobin 11.9 (L) 12.0 - 15.0 g/dL   HCT 57.8 (L) 46.9 -  46.0 %   MCV 77.2 (L) 78.0 - 100.0 fL   MCH 27.7 26.0 - 34.0 pg   MCHC 35.8 30.0 - 36.0 g/dL   RDW 16.114.1 09.611.5 - 04.515.5 %   Platelets 275 150 - 400 K/uL   Neutrophils Relative % 77 %   Neutro Abs 6.6 1.7 - 7.7 K/uL   Lymphocytes Relative 19 %   Lymphs Abs 1.6 0.7 - 4.0 K/uL   Monocytes Relative 4 %   Monocytes Absolute 0.3 0.1 - 1.0 K/uL   Eosinophils Relative 0 %   Eosinophils Absolute 0.0 0.0 - 0.7 K/uL   Basophils Relative 0 %   Basophils Absolute 0.0 0.0 - 0.1 K/uL  Comprehensive metabolic panel   Collection Time: 06/22/16  8:19 PM  Result Value Ref Range   Sodium 137 135 - 145 mmol/L   Potassium 3.8 3.5 - 5.1  mmol/L   Chloride 103 101 - 111 mmol/L   CO2 26 22 - 32 mmol/L   Glucose, Bld 82 65 - 99 mg/dL   BUN 6 6 - 20 mg/dL   Creatinine, Ser 4.090.57 0.44 - 1.00 mg/dL   Calcium 9.3 8.9 - 81.110.3 mg/dL   Total Protein 7.3 6.5 - 8.1 g/dL   Albumin 3.0 (L) 3.5 - 5.0 g/dL   AST 21 15 - 41 U/L   ALT 11 (L) 14 - 54 U/L   Alkaline Phosphatase 112 38 - 126 U/L   Total Bilirubin 1.1 0.3 - 1.2 mg/dL   GFR calc non Af Amer >60 >60 mL/min   GFR calc Af Amer >60 >60 mL/min   Anion gap 8 5 - 15    Patient Active Problem List   Diagnosis Date Noted  . Transient hypertension 05/24/2016    Assessment: Andrea Oneill is a 21 y.o. G1P0 at 5531w0d here for IOL for gHTN. Patient had normal PIH labs but elevated BP on presentation to MAU 2 days in a row.   #Labor: Patient IOL for gHTN. Having some contractions. Will augment with pitocin. #Pain: Fentanyl 100mg  q1 hours PRN, may have epidural. #FWB: Category 1 #ID:  GBS negative #MOF: breast #MOC:nexplanon #Circ:  outpatient  Ernestina Pennaicholas Schenk 06/22/2016, 10:42 PM

## 2016-06-23 ENCOUNTER — Inpatient Hospital Stay (HOSPITAL_COMMUNITY): Payer: Medicaid Other | Admitting: Anesthesiology

## 2016-06-23 LAB — CBC
HCT: 31 % — ABNORMAL LOW (ref 36.0–46.0)
Hemoglobin: 11.2 g/dL — ABNORMAL LOW (ref 12.0–15.0)
MCH: 28 pg (ref 26.0–34.0)
MCHC: 36.1 g/dL — ABNORMAL HIGH (ref 30.0–36.0)
MCV: 77.5 fL — ABNORMAL LOW (ref 78.0–100.0)
Platelets: 245 10*3/uL (ref 150–400)
RBC: 4 MIL/uL (ref 3.87–5.11)
RDW: 14 % (ref 11.5–15.5)
WBC: 12 10*3/uL — ABNORMAL HIGH (ref 4.0–10.5)

## 2016-06-23 LAB — RPR: RPR Ser Ql: NONREACTIVE

## 2016-06-23 LAB — ABO/RH: ABO/RH(D): A POS

## 2016-06-23 MED ORDER — LACTATED RINGERS IV SOLN: 500.0000 mL | Freq: Once | INTRAVENOUS | Status: DC

## 2016-06-23 MED ORDER — ONDANSETRON HCL 4 MG/2ML IJ SOLN
4.0000 mg | Freq: Four times a day (QID) | INTRAMUSCULAR | Status: DC | PRN
  Administered 2016-06-24: 4 mg via INTRAVENOUS
  Filled 2016-06-23: qty 2

## 2016-06-23 MED ORDER — DIPHENHYDRAMINE HCL 50 MG/ML IJ SOLN: 12.5000 mg | INTRAMUSCULAR | Status: DC | PRN

## 2016-06-23 MED ORDER — OXYCODONE-ACETAMINOPHEN 5-325 MG PO TABS: 2.0000 | ORAL_TABLET | ORAL | Status: DC | PRN

## 2016-06-23 MED ORDER — BUPIVACAINE HCL (PF) 0.25 % IJ SOLN
INTRAMUSCULAR | Status: DC | PRN
  Administered 2016-06-23 (×2): 5 mL via EPIDURAL

## 2016-06-23 MED ORDER — LACTATED RINGERS IV SOLN
INTRAVENOUS | Status: DC
  Administered 2016-06-23: 150 mL via INTRAUTERINE

## 2016-06-23 MED ORDER — LACTATED RINGERS IV SOLN: 500.0000 mL | INTRAVENOUS | Status: DC | PRN

## 2016-06-23 MED ORDER — OXYCODONE-ACETAMINOPHEN 5-325 MG PO TABS: 1.0000 | ORAL_TABLET | ORAL | Status: DC | PRN

## 2016-06-23 MED ORDER — EPHEDRINE 5 MG/ML INJ
10.0000 mg | INTRAVENOUS | Status: DC | PRN
  Filled 2016-06-23: qty 4

## 2016-06-23 MED ORDER — TERBUTALINE SULFATE 1 MG/ML IJ SOLN
0.2500 mg | Freq: Once | INTRAMUSCULAR | Status: DC | PRN
  Filled 2016-06-23: qty 1

## 2016-06-23 MED ORDER — EPHEDRINE 5 MG/ML INJ: 10.0000 mg | INTRAVENOUS | Status: DC | PRN

## 2016-06-23 MED ORDER — FENTANYL 2.5 MCG/ML BUPIVACAINE 1/10 % EPIDURAL INFUSION (WH - ANES)
14.0000 mL/h | INTRAMUSCULAR | Status: DC | PRN
  Administered 2016-06-23 (×4): 14 mL/h via EPIDURAL
  Filled 2016-06-23 (×3): qty 100

## 2016-06-23 MED ORDER — LIDOCAINE HCL (PF) 1 % IJ SOLN
30.0000 mL | INTRAMUSCULAR | Status: AC | PRN
  Administered 2016-06-24: 30 mL via SUBCUTANEOUS
  Filled 2016-06-23: qty 30

## 2016-06-23 MED ORDER — FENTANYL CITRATE (PF) 100 MCG/2ML IJ SOLN: 100.0000 ug | INTRAMUSCULAR | Status: DC | PRN

## 2016-06-23 MED ORDER — OXYTOCIN 40 UNITS IN LACTATED RINGERS INFUSION - SIMPLE MED: 1.0000 m[IU]/min | INTRAVENOUS | Status: DC

## 2016-06-23 MED ORDER — PHENYLEPHRINE 40 MCG/ML (10ML) SYRINGE FOR IV PUSH (FOR BLOOD PRESSURE SUPPORT)
80.0000 ug | PREFILLED_SYRINGE | INTRAVENOUS | Status: DC | PRN
  Filled 2016-06-23: qty 10

## 2016-06-23 MED ORDER — FLEET ENEMA 7-19 GM/118ML RE ENEM: 1.0000 | ENEMA | RECTAL | Status: DC | PRN

## 2016-06-23 MED ORDER — PHENYLEPHRINE 40 MCG/ML (10ML) SYRINGE FOR IV PUSH (FOR BLOOD PRESSURE SUPPORT): 80.0000 ug | PREFILLED_SYRINGE | INTRAVENOUS | Status: DC | PRN

## 2016-06-23 MED ORDER — LIDOCAINE HCL (PF) 1 % IJ SOLN
INTRAMUSCULAR | Status: DC | PRN
  Administered 2016-06-23: 2 mL
  Administered 2016-06-23: 5 mL
  Administered 2016-06-23: 2 mL
  Administered 2016-06-23: 3 mL
  Administered 2016-06-24: 5 mL
  Administered 2016-06-24: 3 mL

## 2016-06-23 MED ORDER — SOD CITRATE-CITRIC ACID 500-334 MG/5ML PO SOLN: 30.0000 mL | ORAL | Status: DC | PRN

## 2016-06-23 MED ORDER — OXYTOCIN BOLUS FROM INFUSION
500.0000 mL | Freq: Once | INTRAVENOUS | Status: AC
  Administered 2016-06-24: 500 mL via INTRAVENOUS

## 2016-06-23 MED ORDER — PHENYLEPHRINE 40 MCG/ML (10ML) SYRINGE FOR IV PUSH (FOR BLOOD PRESSURE SUPPORT)
80.0000 ug | PREFILLED_SYRINGE | INTRAVENOUS | Status: DC | PRN
  Filled 2016-06-23: qty 5

## 2016-06-23 MED ORDER — ACETAMINOPHEN 325 MG PO TABS
650.0000 mg | ORAL_TABLET | ORAL | Status: DC | PRN
  Administered 2016-06-24: 650 mg via ORAL
  Filled 2016-06-23: qty 2

## 2016-06-23 MED ORDER — OXYTOCIN 40 UNITS IN LACTATED RINGERS INFUSION - SIMPLE MED
2.5000 [IU]/h | INTRAVENOUS | Status: DC
  Filled 2016-06-23: qty 1000

## 2016-06-23 MED ORDER — FENTANYL 2.5 MCG/ML BUPIVACAINE 1/10 % EPIDURAL INFUSION (WH - ANES): 14.0000 mL/h | INTRAMUSCULAR | Status: DC | PRN

## 2016-06-23 MED ORDER — LACTATED RINGERS IV SOLN: INTRAVENOUS | Status: DC

## 2016-06-23 NOTE — Progress Notes (Signed)
Chelsea AusJakema N Goga is a 21 y.o. G1P0 at 3780w1d by ultrasound admitted for induction of labor due to Hypertension.  Subjective:   Objective: BP 129/84   Pulse 83   Temp 98.2 F (36.8 C) (Oral)   Resp 20   Ht 5\' 8"  (1.727 m)   Wt 230 lb (104.3 kg)   LMP 09/23/2015   BMI 34.97 kg/m  No intake/output data recorded. No intake/output data recorded.  FHT:  FHR: 130's bpm, variability: moderate,  accelerations:  Present,  decelerations:  Absent UC:   irregular, every 3-5 minutes and mild SVE:   Dilation: 1.5 Effacement (%): 70 Station: -2 Exam by:: Abran Cantor. Lewis, RN  Labs: Lab Results  Component Value Date   WBC 8.6 06/22/2016   HGB 11.7 (L) 06/22/2016   HCT 32.6 (L) 06/22/2016   MCV 77.3 (L) 06/22/2016   PLT 265 06/22/2016    Assessment / Plan: Induction of labor due to gestational hypertension,  progressing well on pitocin  Labor: yet to be in labor Preeclampsia:  no signs or symptoms of toxicity, intake and ouput balanced and labs stable Fetal Wellbeing:  Category I Pain Control:  Epidural I/D:  n/a Anticipated MOD:  NSVD  Wyvonnia DuskyMarie Delane Wessinger 06/23/2016, 10:00 AM

## 2016-06-23 NOTE — Progress Notes (Signed)
Andrea Oneill is a 21 y.o. G1P0 at 3817w1d by ultrasound admitted for induction of labor due to Hypertension.  Subjective:   Objective: BP (!) 144/101   Pulse 99   Temp 98.9 F (37.2 C) (Oral)   Resp 18   Ht 5\' 8"  (1.727 m)   Wt 230 lb (104.3 kg)   LMP 09/23/2015   BMI 34.97 kg/m  I/O last 3 completed shifts: In: -  Out: 450 [Urine:450] No intake/output data recorded.  FHT:  FHR: 130's bpm, variability: moderate,  accelerations:  Present,  decelerations:  Absent UC:   regular, every 5 minutes SVE:   Dilation: Lip/rim Effacement (%):  (puffy) Station: 0 Exam by:: s grindstaff rn  Labs: Lab Results  Component Value Date   WBC 8.6 06/22/2016   HGB 11.7 (L) 06/22/2016   HCT 32.6 (L) 06/22/2016   MCV 77.3 (L) 06/22/2016   PLT 265 06/22/2016    Assessment / Plan: Induction of labor due to gestational hypertension,  progressing well on pitocin  Labor: Progressing normally Preeclampsia:  no signs or symptoms of toxicity, intake and ouput balanced and labs stable Fetal Wellbeing:  Category I Pain Control:  Epidural I/D:  n/a Anticipated MOD:  NSVD  Andrea Oneill 06/23/2016, 7:32 PM

## 2016-06-23 NOTE — Anesthesia Preprocedure Evaluation (Signed)
Anesthesia Evaluation  Patient identified by MRN, date of birth, ID band Patient awake    Reviewed: Allergy & Precautions, NPO status , Patient's Chart, lab work & pertinent test results  Airway Mallampati: II       Dental   Pulmonary neg pulmonary ROS,    Pulmonary exam normal        Cardiovascular hypertension (PIH), Normal cardiovascular exam     Neuro/Psych negative neurological ROS     GI/Hepatic negative GI ROS, Neg liver ROS,   Endo/Other  negative endocrine ROS  Renal/GU negative Renal ROS     Musculoskeletal   Abdominal   Peds  Hematology negative hematology ROS (+)   Anesthesia Other Findings   Reproductive/Obstetrics (+) Pregnancy                             Lab Results  Component Value Date   WBC 8.6 06/22/2016   HGB 11.7 (L) 06/22/2016   HCT 32.6 (L) 06/22/2016   MCV 77.3 (L) 06/22/2016   PLT 265 06/22/2016    Anesthesia Physical Anesthesia Plan  ASA: II  Anesthesia Plan: Epidural   Post-op Pain Management:    Induction:   Airway Management Planned: Natural Airway  Additional Equipment:   Intra-op Plan:   Post-operative Plan:   Informed Consent: I have reviewed the patients History and Physical, chart, labs and discussed the procedure including the risks, benefits and alternatives for the proposed anesthesia with the patient or authorized representative who has indicated his/her understanding and acceptance.     Plan Discussed with:   Anesthesia Plan Comments:         Anesthesia Quick Evaluation

## 2016-06-23 NOTE — Progress Notes (Signed)
IUPC connected to efm after cable had been zeroed

## 2016-06-23 NOTE — Progress Notes (Signed)
Peri care given. IFSE connected to efm.

## 2016-06-23 NOTE — Anesthesia Pain Management Evaluation Note (Signed)
  CRNA Pain Management Visit Note  Patient: Andrea Oneill, 21 y.o., female  "Hello I am a member of the anesthesia team at [facility]. We have an anesthesia team available at all times to provide care throughout the hospital, including epidural management and anesthesia for C-section. I don't know your plan for the delivery whether it a natural birth, water birth, IV sedation, nitrous supplementation, doula or epidural, but we want to meet your pain goals."   1.Was your pain managed to your expectations on prior hospitalizations?   No prior hospitalizations  2.What is your expectation for pain management during this hospitalization?     Epidural  3.How can we help you reach that goal? unsure  Record the patient's initial score and the patient's pain goal.   Pain: 7  Pain Goal: 7 [facility] wants you to be able to say your pain was always managed very well.  Andrea ShellingBURGER,Andrea Oneill 06/23/2016

## 2016-06-23 NOTE — Anesthesia Procedure Notes (Signed)
Epidural Patient location during procedure: OB Start time: 06/23/2016 8:22 AM End time: 06/23/2016 8:32 AM  Staffing Anesthesiologist: Marcene DuosFITZGERALD, Reizel Calzada Performed: anesthesiologist   Preanesthetic Checklist Completed: patient identified, site marked, surgical consent, pre-op evaluation, timeout performed, IV checked, risks and benefits discussed and monitors and equipment checked  Epidural Patient position: sitting Prep: site prepped and draped and DuraPrep Patient monitoring: continuous pulse ox and blood pressure Approach: midline Location: L4-L5 Injection technique: LOR air  Needle:  Needle type: Tuohy  Needle gauge: 17 G Needle length: 9 cm and 9 Needle insertion depth: 6.5 cm Catheter type: closed end flexible Catheter size: 19 Gauge Catheter at skin depth: 14 (11.5--> 14cm when laid in lateral decub position.) cm Test dose: negative  Assessment Events: blood not aspirated, injection not painful, no injection resistance, negative IV test and no paresthesia

## 2016-06-23 NOTE — Progress Notes (Signed)
IUPC zeroed.  Marlynn Perking Lawson CNM consulted and IUPC removed.  Pt placed in right exaggerated with peanut. FHR with prolonged variable that resolved without intervention.  Toco re applied

## 2016-06-23 NOTE — Progress Notes (Signed)
Andrea Oneill is a 21 y.o. G1P0 at 7969w1d by ultrasound admitted for induction of labor [facility]  Subjective: Comfortable in bed sleeping  Objective: BP 134/73   Pulse 66   Temp 99.3 F (37.4 C) (Axillary)   Resp 18   Ht 5\' 8"  (1.727 m)   Wt 104.3 kg (230 lb)   LMP 09/23/2015   BMI 34.97 kg/m  No intake/output data recorded. No intake/output data recorded.  FHT:  FHR: 130 bpm, variability: moderate,  accelerations:  Present,  decelerations:  Absent UC:   regular, every 1.5-4 minutes SVE:   Dilation: 1.5 Effacement (%): 70 Station: -2 Exam by:: Andrea Cantor. Lewis, RN  Labs: Lab Results  Component Value Date   WBC 8.6 06/22/2016   HGB 11.7 (L) 06/22/2016   HCT 32.6 (L) 06/22/2016   MCV 77.3 (L) 06/22/2016   PLT 265 06/22/2016    Assessment / Plan: Induction of labor due to gestational hypertension,  progressing well on pitocin  Labor: Progressing on Pitocin, will continue to increase then AROM  Gestational HTN: BP ranging from 125-142/54-84 since admission. Not receiving any antihypertensives at this time Fetal Wellbeing:  Category I Pain Control:  IV pain meds, Epidural when in active labor I/D:  GBS neg Anticipated MOD:  NSVD  Beaulah DinningChristina M Allizon Woznick 06/23/2016, 6:31 AM

## 2016-06-23 NOTE — Progress Notes (Signed)
Andrea Oneill is a 21 y.o. G1P0 at 8764w1d by ultrasound admitted for induction of labor due to Hypertension.  Subjective:   Objective: BP (!) 145/86   Pulse 73   Temp 98.1 F (36.7 C) (Oral)   Resp 20   Ht 5\' 8"  (1.727 m)   Wt 230 lb (104.3 kg)   LMP 09/23/2015   BMI 34.97 kg/m  No intake/output data recorded. No intake/output data recorded.  FHT:  FHR: 130 bpm, variability: moderate,  accelerations:  Present,  decelerations:  Absent UC:   regular, every 2-3 minutes mild SVE:   Dilation: 6 Effacement (%): 90 Station: 0 Exam by:: s grindstaff rn  Labs: Lab Results  Component Value Date   WBC 8.6 06/22/2016   HGB 11.7 (L) 06/22/2016   HCT 32.6 (L) 06/22/2016   MCV 77.3 (L) 06/22/2016   PLT 265 06/22/2016    Assessment / Plan: Augmentation of labor, progressing well  Labor: Progressing normally Preeclampsia:  no signs or symptoms of toxicity and intake and ouput balanced Fetal Wellbeing:  Category I Pain Control:  Epidural I/D:  n/a Anticipated MOD:  NSVD  Andrea Oneill 06/23/2016, 1:48 PM

## 2016-06-24 DIAGNOSIS — O134 Gestational [pregnancy-induced] hypertension without significant proteinuria, complicating childbirth: Secondary | ICD-10-CM

## 2016-06-24 DIAGNOSIS — Z3A39 39 weeks gestation of pregnancy: Secondary | ICD-10-CM

## 2016-06-24 LAB — CBC
HEMATOCRIT: 22.1 % — AB (ref 36.0–46.0)
HEMOGLOBIN: 8 g/dL — AB (ref 12.0–15.0)
MCH: 28.2 pg (ref 26.0–34.0)
MCHC: 36.2 g/dL — AB (ref 30.0–36.0)
MCV: 77.8 fL — ABNORMAL LOW (ref 78.0–100.0)
Platelets: 213 10*3/uL (ref 150–400)
RBC: 2.84 MIL/uL — ABNORMAL LOW (ref 3.87–5.11)
RDW: 13.7 % (ref 11.5–15.5)
WBC: 18.8 10*3/uL — AB (ref 4.0–10.5)

## 2016-06-24 LAB — POSTPARTUM HEMORRHAGE PROTOCOL (BB NOTIFICATION)

## 2016-06-24 LAB — DIC (DISSEMINATED INTRAVASCULAR COAGULATION) PANEL
D DIMER QUANT: 3.74 ug{FEU}/mL — AB (ref 0.00–0.50)
INR: 1.32
PLATELETS: 187 10*3/uL (ref 150–400)

## 2016-06-24 LAB — PREPARE RBC (CROSSMATCH)

## 2016-06-24 LAB — DIC (DISSEMINATED INTRAVASCULAR COAGULATION)PANEL
Fibrinogen: 372 mg/dL (ref 210–475)
Prothrombin Time: 16.4 seconds — ABNORMAL HIGH (ref 11.4–15.2)
Smear Review: NONE SEEN
aPTT: 35 seconds (ref 24–36)

## 2016-06-24 MED ORDER — ZOLPIDEM TARTRATE 5 MG PO TABS: 5.0000 mg | ORAL_TABLET | Freq: Every evening | ORAL | Status: DC | PRN

## 2016-06-24 MED ORDER — PRENATAL MULTIVITAMIN CH
1.0000 | ORAL_TABLET | Freq: Every day | ORAL | Status: DC
  Administered 2016-06-24 – 2016-06-25 (×2): 1 via ORAL
  Filled 2016-06-24 (×2): qty 1

## 2016-06-24 MED ORDER — METHYLERGONOVINE MALEATE 0.2 MG PO TABS: 0.2000 mg | ORAL_TABLET | ORAL | Status: DC

## 2016-06-24 MED ORDER — OXYCODONE HCL 5 MG PO TABS: 5.0000 mg | ORAL_TABLET | Freq: Four times a day (QID) | ORAL | Status: DC | PRN

## 2016-06-24 MED ORDER — BENZOCAINE-MENTHOL 20-0.5 % EX AERO: 1.0000 "application " | INHALATION_SPRAY | CUTANEOUS | Status: DC | PRN

## 2016-06-24 MED ORDER — CEFAZOLIN SODIUM-DEXTROSE 2-4 GM/100ML-% IV SOLN
2.0000 g | Freq: Once | INTRAVENOUS | Status: AC
  Administered 2016-06-24: 2 g via INTRAVENOUS
  Filled 2016-06-24: qty 100

## 2016-06-24 MED ORDER — FERROUS SULFATE 325 (65 FE) MG PO TABS
325.0000 mg | ORAL_TABLET | Freq: Two times a day (BID) | ORAL | Status: DC
  Administered 2016-06-24 – 2016-06-25 (×3): 325 mg via ORAL
  Filled 2016-06-24 (×3): qty 1

## 2016-06-24 MED ORDER — METHYLERGONOVINE MALEATE 0.2 MG PO TABS
0.2000 mg | ORAL_TABLET | ORAL | Status: AC
  Administered 2016-06-24 – 2016-06-25 (×6): 0.2 mg via ORAL
  Filled 2016-06-24 (×6): qty 1

## 2016-06-24 MED ORDER — SIMETHICONE 80 MG PO CHEW: 80.0000 mg | CHEWABLE_TABLET | ORAL | Status: DC | PRN

## 2016-06-24 MED ORDER — IBUPROFEN 600 MG PO TABS
600.0000 mg | ORAL_TABLET | Freq: Four times a day (QID) | ORAL | Status: DC
  Administered 2016-06-24 – 2016-06-26 (×7): 600 mg via ORAL
  Filled 2016-06-24 (×7): qty 1

## 2016-06-24 MED ORDER — DIPHENHYDRAMINE HCL 25 MG PO CAPS: 25.0000 mg | ORAL_CAPSULE | Freq: Four times a day (QID) | ORAL | Status: DC | PRN

## 2016-06-24 MED ORDER — SODIUM BICARBONATE 8.4 % IV SOLN
INTRAVENOUS | Status: DC | PRN
  Administered 2016-06-23 (×2): 5 mL via EPIDURAL

## 2016-06-24 MED ORDER — ONDANSETRON HCL 4 MG PO TABS: 4.0000 mg | ORAL_TABLET | ORAL | Status: DC | PRN

## 2016-06-24 MED ORDER — METHYLERGONOVINE MALEATE 0.2 MG/ML IJ SOLN
INTRAMUSCULAR | Status: AC
  Filled 2016-06-24: qty 1

## 2016-06-24 MED ORDER — CARBOPROST TROMETHAMINE 250 MCG/ML IM SOLN
250.0000 ug | Freq: Once | INTRAMUSCULAR | Status: AC
  Administered 2016-06-24: 250 ug via INTRAMUSCULAR

## 2016-06-24 MED ORDER — TETANUS-DIPHTH-ACELL PERTUSSIS 5-2.5-18.5 LF-MCG/0.5 IM SUSP: 0.5000 mL | Freq: Once | INTRAMUSCULAR | Status: DC

## 2016-06-24 MED ORDER — COCONUT OIL OIL: 1.0000 "application " | TOPICAL_OIL | Status: DC | PRN

## 2016-06-24 MED ORDER — ACETAMINOPHEN 325 MG PO TABS
650.0000 mg | ORAL_TABLET | ORAL | Status: DC | PRN
  Administered 2016-06-24 (×2): 650 mg via ORAL
  Filled 2016-06-24 (×2): qty 2

## 2016-06-24 MED ORDER — METHYLERGONOVINE MALEATE 0.2 MG/ML IJ SOLN
0.2000 mg | Freq: Once | INTRAMUSCULAR | Status: AC
  Administered 2016-06-24: 0.2 mg via INTRAMUSCULAR

## 2016-06-24 MED ORDER — SODIUM CHLORIDE 0.9 % IV SOLN: Freq: Once | INTRAVENOUS | Status: DC

## 2016-06-24 MED ORDER — SENNOSIDES-DOCUSATE SODIUM 8.6-50 MG PO TABS
2.0000 | ORAL_TABLET | ORAL | Status: DC
  Administered 2016-06-25 (×2): 2 via ORAL
  Filled 2016-06-24 (×2): qty 2

## 2016-06-24 MED ORDER — MISOPROSTOL 200 MCG PO TABS
800.0000 ug | ORAL_TABLET | Freq: Once | ORAL | Status: AC
  Administered 2016-06-24: 800 ug via BUCCAL

## 2016-06-24 MED ORDER — MISOPROSTOL 200 MCG PO TABS
ORAL_TABLET | ORAL | Status: AC
  Filled 2016-06-24: qty 4

## 2016-06-24 MED ORDER — DIPHENOXYLATE-ATROPINE 2.5-0.025 MG PO TABS
2.0000 | ORAL_TABLET | Freq: Four times a day (QID) | ORAL | Status: DC | PRN
  Administered 2016-06-24: 2 via ORAL
  Filled 2016-06-24: qty 2

## 2016-06-24 MED ORDER — WITCH HAZEL-GLYCERIN EX PADS: 1.0000 "application " | MEDICATED_PAD | CUTANEOUS | Status: DC | PRN

## 2016-06-24 MED ORDER — DIBUCAINE 1 % RE OINT: 1.0000 "application " | TOPICAL_OINTMENT | RECTAL | Status: DC | PRN

## 2016-06-24 MED ORDER — ONDANSETRON HCL 4 MG/2ML IJ SOLN: 4.0000 mg | INTRAMUSCULAR | Status: DC | PRN

## 2016-06-24 NOTE — Anesthesia Postprocedure Evaluation (Signed)
Anesthesia Post Note  Patient: Andrea Oneill  Procedure(s) Performed: * No procedures listed *  Patient location during evaluation: Mother Baby Anesthesia Type: Epidural Level of consciousness: awake and alert and oriented Pain management: satisfactory to patient Vital Signs Assessment: post-procedure vital signs reviewed and stable Respiratory status: spontaneous breathing and nonlabored ventilation Cardiovascular status: stable Postop Assessment: no headache, no backache, no signs of nausea or vomiting, adequate PO intake and patient able to bend at knees (patient up walking) Anesthetic complications: no        Last Vitals:  Vitals:   06/24/16 0900 06/24/16 1415  BP: (!) 143/81 132/75  Pulse: 98   Resp: 20   Temp: 36.8 C     Last Pain:  Vitals:   06/24/16 1434  TempSrc:   PainSc: 0-No pain   Pain Goal: Patients Stated Pain Goal: 2 (06/24/16 0806)               Madison HickmanGREGORY,Sameria Morss

## 2016-06-24 NOTE — Progress Notes (Signed)
Went to evaluate patient given that she had postpartum hemorrhage overnight requiring hemabate, methergine, and cytotec. Hemoglobin downtrended to 8 from 11.2. Patient reported having a decrease in her bleeding. She reports some dizziness with standing and ambulation. No chest pain, palpitations, shortness of breath.   Cardiac: RRR Respiratory: lungs CTAB Abdomen: uterus is firm  Assessment and plan:  - Will repeat am H/H - Will start FeSO4 325mg  BID - Continue to monitor

## 2016-06-24 NOTE — Progress Notes (Signed)
UR chart review completed.  

## 2016-06-24 NOTE — Anesthesia Procedure Notes (Addendum)
Epidural Patient location during procedure: OB Start time: 06/24/2016 11:45 PM End time: 06/24/2016 11:55 PM  Staffing Anesthesiologist: Marcene DuosFITZGERALD, Lateesha Bezold Performed: anesthesiologist   Preanesthetic Checklist Completed: patient identified, site marked, surgical consent, pre-op evaluation, timeout performed, IV checked, risks and benefits discussed and monitors and equipment checked  Epidural Patient position: sitting Prep: site prepped and draped and DuraPrep Patient monitoring: continuous pulse ox and blood pressure Approach: midline Location: L5-S1 Injection technique: LOR air  Needle:  Needle type: Tuohy  Needle gauge: 17 G Needle length: 9 cm and 9 Needle insertion depth: 7 cm Catheter type: closed end flexible Catheter size: 19 Gauge Catheter at skin depth: 14 (12cm-->14cm when laid in right lateral decub position.) cm Test dose: negative  Assessment Events: blood not aspirated, injection not painful, no injection resistance, negative IV test and no paresthesia

## 2016-06-24 NOTE — Progress Notes (Signed)
Andrea Oneill is a 21 y.o. G1P0 at 5181w2d  admitted for induction of labor [facility]  Subjective: Patient stating she feels very uncomfortable and feels like she has to poop  Objective: BP 103/68   Pulse (!) 115   Temp 98.6 F (37 C) (Oral)   Resp (!) 22   Ht 5\' 8"  (1.727 m)   Wt 104.3 kg (230 lb)   LMP 09/23/2015   SpO2 100%   BMI 34.97 kg/m  I/O last 3 completed shifts: In: -  Out: 450 [Urine:450] Total I/O In: -  Out: 250 [Urine:250]  FHT:  FHR: 135 bpm, variability: moderate,  accelerations:  Present,  decelerations:  Absent UC:   regular, every 1-3 minutes SVE:   Dilation: Lip/rim Effacement (%): 100 Station: +1 Exam by:: Mindy Trogdon RN  Labs: Lab Results  Component Value Date   WBC 12.0 (H) 06/23/2016   HGB 11.2 (L) 06/23/2016   HCT 31.0 (L) 06/23/2016   MCV 77.5 (L) 06/23/2016   PLT 245 06/23/2016    Assessment / Plan: Induction of labor due to gestational hypertension,  progressing well on pitocin  Labor: Progressing normally on Pitocin. Still has a rim of cervix around 11 oclock position still but otherwise completely dilated. Had patient change positions to see if we can get the baby to move down more. Will reevaluate in 30 minutres Preeclampsia:  no signs or symptoms of toxicity Fetal Wellbeing:  Category I Pain Control:  Epidural I/D:  GBS neg Anticipated MOD:  NSVD  Andrea Oneill 06/24/2016, 2:16 AM

## 2016-06-24 NOTE — Progress Notes (Signed)
Andrea Oneill is a 21 y.o. G1P0 at 3012w2d  admitted for induction of labor [facility]  Subjective: Patient doing well. Had another epidural and is comfortable. She is wondering why she has not had the baby yet.   Objective: BP (!) 160/92   Pulse 99   Temp 98.6 F (37 C) (Oral)   Resp 20   Ht 5\' 8"  (1.727 m)   Wt 104.3 kg (230 lb)   LMP 09/23/2015   SpO2 100%   BMI 34.97 kg/m  I/O last 3 completed shifts: In: -  Out: 450 [Urine:450] Total I/O In: -  Out: 250 [Urine:250]  FHT:  FHR: 145 bpm, variability: moderate,  accelerations:  Abscent,  decelerations:  Absent. Did have more accelerations about 1 hour ago UC:   regular, every 2-273minutes SVE:   Dilation: Lip/rim Effacement (%): 100 Station: +1 Exam by:: Ellaina Schuler MD  Labs: Lab Results  Component Value Date   WBC 12.0 (H) 06/23/2016   HGB 11.2 (L) 06/23/2016   HCT 31.0 (L) 06/23/2016   MCV 77.5 (L) 06/23/2016   PLT 245 06/23/2016    Assessment / Plan: Induction of labor due to gestational hypertension,  progressing well on pitocin  Labor: Progressing normally on Pitocin. Still has a rim of cervix around 11 oclock position still but otherwise completely dilated. Discussed patient with Darlene and we will continue to let patient labor down some more before pushing Preeclampsia:  no signs or symptoms of toxicity Fetal Wellbeing:  Category I Pain Control:  Epidural I/D:  GBS neg Anticipated MOD:  NSVD  Beaulah DinningChristina M Orpheus [place]hurst 06/24/2016, 12:31 AM

## 2016-06-25 LAB — CBC WITH DIFFERENTIAL/PLATELET
BASOS PCT: 0 %
Basophils Absolute: 0 10*3/uL (ref 0.0–0.1)
Eosinophils Absolute: 0.3 10*3/uL (ref 0.0–0.7)
Eosinophils Relative: 2 %
HEMATOCRIT: 21.1 % — AB (ref 36.0–46.0)
HEMOGLOBIN: 7.7 g/dL — AB (ref 12.0–15.0)
LYMPHS PCT: 17 %
Lymphs Abs: 2.4 10*3/uL (ref 0.7–4.0)
MCH: 28.2 pg (ref 26.0–34.0)
MCHC: 36.5 g/dL — AB (ref 30.0–36.0)
MCV: 77.3 fL — ABNORMAL LOW (ref 78.0–100.0)
MONOS PCT: 4 %
Monocytes Absolute: 0.5 10*3/uL (ref 0.1–1.0)
NEUTROS ABS: 10.7 10*3/uL — AB (ref 1.7–7.7)
NEUTROS PCT: 77 %
Platelets: 196 10*3/uL (ref 150–400)
RBC: 2.73 MIL/uL — ABNORMAL LOW (ref 3.87–5.11)
RDW: 14.3 % (ref 11.5–15.5)
WBC: 13.9 10*3/uL — ABNORMAL HIGH (ref 4.0–10.5)

## 2016-06-25 NOTE — Progress Notes (Signed)
Post Partum Day 1 Subjective: no complaints, up ad lib, voiding and tolerating PO  Objective: Blood pressure 114/66, pulse 72, temperature 97.6 F (36.4 C), temperature source Oral, resp. rate 18, height 5\' 8"  (1.727 m), weight 230 lb (104.3 kg), last menstrual period 09/23/2015, SpO2 99 %.  Physical Exam:  General: alert, cooperative and no distress Lochia: appropriate Uterine Fundus: firm DVT Evaluation: No evidence of DVT seen on physical exam. Negative Homan's sign.   Recent Labs  06/24/16 1442 06/25/16 0521  HGB 8.0* 7.7*  HCT 22.1* 21.1*    Assessment/Plan: Plan for discharge tomorrow and Contraception nexplanon at 6 week PP visit  No concerns. Baby not feeding well.   LOS: 3 days   Levie HeritageJacob J Tran Arzuaga 06/25/2016, 7:42 AM

## 2016-06-25 NOTE — Progress Notes (Signed)
CLINICAL SOCIAL WORK MATERNAL/CHILD NOTE  Patient Details  Name: Andrea Oneill MRN: [id number] Date of Birth: 06/24/2016  Date:  06/25/2016  Clinical Social Worker Initiating Note:  Terri Piedra, [place] Date/ Time Initiated:  06/25/16/1400     Child's Name:  Andrea Oneill.   Legal Guardian:  Other (Comment) Winferd Humphrey and Wynonia Hazard Sr.)   Need for Interpreter:  None   Date of Referral:  06/25/16     Reason for Referral:  Current Substance Use/Substance Use During Pregnancy    Referral Source:  [facility]   Address:  [address], [place], [place] [address]  Phone number:  [phone]   Household Members:  Parents (MOB lives with her mother)   Natural Supports (not living in the home):  Friends, Immediate Family, Extended Family (MOB reports that she has a good support system)   Professional Supports: None   Employment:     Type of Work:     Education:      Pensions consultant:  Medicaid   Other Resources:      Cultural/Religious Considerations Which May Impact Care: None stated.  Strengths:  Ability to meet basic needs , Home prepared for child    Risk Factors/Current Problems:  None   Cognitive State:  Able to Concentrate , Alert , Linear Thinking , Goal Oriented    Mood/Affect:  Calm , Comfortable , Interested    CSW Assessment: CSW met with MOB and MGM in MOB's first floor room/129 to offer support and complete assessment due to hx of marijuana use.  MOB[facility] PNR [facility] the [facility] notes one time use of marijuana on 02/21/16. MOB was quiet, but pleasant and welcoming of CSW's visit.  She states we can talk openly with her mother present and stated this was a good time to talk with her.  MGM was holding baby in the chair.  MOB seemed slightly distracted at times as she was giving her mother a bottle to feed baby. MOB reports that she and baby are doing well.  She spoke about labor and delivery and reports that it was how she expected it would be and  that it was "painful."  She states she is excited about becoming a mother.  She reports good supports and that she and FOB are not in a relationship, but that he was here for baby's delivery.  She states he is supportive. She reports that she has all necessary supplies for infant at home other than a car seat.  MGM added that FOB was supposed to provide baby with a car seat yesterday, but he did not return to the hospital as he stated he would.  They feel they have exhausted all other resources at this time.  CSW informed them of [employer]' car seat program.  They were extremely appreciative and report that together they have $30.  MOB reached back and took a $20 bill out of a baby card and MGM stated that she had a $10 bill in her car.  CSW left message for [facility] and also notified bedside RN. CSW provided education regarding SIDS precautions and PMADs.  MOB was attentive and stated understanding of the importance of both placing baby to sleep on his back in his own environment and monitoring for symptoms of PMADs.  CSW provided information about support groups held at [facility] and gave MOB a "New Mom Checklist" as a self-evaluation tool during the postpartum time period.   CSW inquired about marijuana  use documented in her PNR.  MOB reports that she has not smoked marijuana since "a long time ago," and reports that she does not feel it is a problem for her.  She was understanding of hospital drug screen policy explained by CSW.  CSW will monitor baby's drug screen results and make report to Child Protective Services if warranted.    CSW Plan/Description:  Information/Referral to [employer] , No Further Intervention Required/No Barriers to Discharge, Patient/Family Education     Alphonzo Cruise, [place] 06/25/2016, 3:50 PM

## 2016-06-26 DIAGNOSIS — O134 Gestational [pregnancy-induced] hypertension without significant proteinuria, complicating childbirth: Secondary | ICD-10-CM

## 2016-06-26 DIAGNOSIS — Z3A39 39 weeks gestation of pregnancy: Secondary | ICD-10-CM

## 2016-06-26 LAB — TYPE AND SCREEN
ABO/RH(D): A POS
ANTIBODY SCREEN: NEGATIVE
UNIT DIVISION: 0
Unit division: 0

## 2016-06-26 MED ORDER — FERROUS SULFATE 325 (65 FE) MG PO TABS: 325.0000 mg | ORAL_TABLET | Freq: Two times a day (BID) | ORAL | 3 refills | Status: DC

## 2016-06-26 MED ORDER — DOCUSATE SODIUM 100 MG PO CAPS: 100.0000 mg | ORAL_CAPSULE | Freq: Two times a day (BID) | ORAL | 0 refills | Status: DC

## 2016-06-26 NOTE — Discharge Summary (Signed)
OB Discharge Summary     Patient Name: Andrea Oneill DOB: 05/03/1996 MRN: [id number]  Date of admission: 06/22/2016 Delivering MD: Anders SimmondsGAMBINO, CHRISTINA M   Date of discharge: 06/26/2016  Admitting diagnosis: 39 WKS, CTXS Intrauterine pregnancy: 9466w4d     Secondary diagnosis:  Active Problems:   Gestational hypertension  Additional problems: gHTN, PPH     Discharge diagnosis: Term Pregnancy Delivered                                                                                                Post partum procedures:none  Augmentation: pitocin  Complications: Hemorrhage>104500mL  Hospital course:  Onset of Labor With Vaginal Delivery     21 y.o. yo G1P0 at 7566w4d was admitted in Latent Labor on 06/22/2016. Patient had an uncomplicated labor course as follows:  Membrane Rupture Time/Date: 12:10 PM ,06/23/2016   Intrapartum Procedures: Episiotomy: None [1]                                         Lacerations:  1st degree [2]  Patient had a delivery of a Viable infant. Following delivery of placenta, patient continued to bleed with uterine massage. Postpartum hemorrhage protocol was initiated. Hemabate 250 mcg, Methergine 0.2 mg IM, as well as 800 mcg of buccal Cytotec was given. A 2nd IV line was started. Lomotil ordered. Maintenance IVF were started. Patient's vaginal bleeding began to subside. The uterus was checked for clots by Dr. Despina HiddenEure. Methergine 0.2 mg q4 hours was ordered.  Follow up Hgb was 8 and at the time of discharge was 7.7, however patient denies symptoms.   06/24/2016  Information for the patient's newborn:  Milus BanisterLester, Boy Keyanna [[id number]][[id number]]  Delivery Method: Vag-Spont    Pateint had an uncomplicated postpartum course.  She is ambulating, tolerating a regular diet, passing flatus, and urinating well. Patient is discharged home in stable condition on 06/26/16.    Physical exam Vitals:   06/25/16 0500 06/25/16 1802 06/25/16 1909 06/26/16 0500  BP: 114/66 (!) 142/92 (!)  141/76 118/64  Pulse: 72 (!) 108 (!) 102 63  Resp: 18 18  18   Temp: 97.6 F (36.4 C) 98.6 F (37 C)  98.1 F (36.7 C)  TempSrc: Oral Oral  Oral  SpO2:      Weight:      Height:       General: alert and cooperative Lochia: appropriate Uterine Fundus: firm Incision: N/A DVT Evaluation: No evidence of DVT seen on physical exam. Labs: Lab Results  Component Value Date   WBC 13.9 (H) 06/25/2016   HGB 7.7 (L) 06/25/2016   HCT 21.1 (L) 06/25/2016   MCV 77.3 (L) 06/25/2016   PLT 196 06/25/2016   CMP Latest Ref Rng & Units 06/22/2016  Glucose 65 - 99 mg/dL 82  BUN 6 - 20 mg/dL 6  Creatinine 7.820.44 - 9.561.00 mg/dL 2.130.57  Sodium 086135 - 578145 mmol/L 137  Potassium 3.5 - 5.1 mmol/L 3.8  Chloride 101 - 111 mmol/L 103  CO2 22 - 32 mmol/L 26  Calcium 8.9 - 10.3 mg/dL 9.3  Total Protein 6.5 - 8.1 g/dL 7.3  Total Bilirubin 0.3 - 1.2 mg/dL 1.1  Alkaline Phos 38 - 126 U/L 112  AST 15 - 41 U/L 21  ALT 14 - 54 U/L 11(L)    Discharge instruction: per After Visit Summary and "Baby and Me Booklet".  After visit meds:  Allergies as of 06/26/2016   No Known Allergies     Medication List    STOP taking these medications   PNV PRENATAL PLUS MULTIVITAMIN 27-1 MG Tabs     TAKE these medications   docusate sodium 100 MG capsule Commonly known as:  COLACE Take 1 capsule (100 mg total) by mouth 2 (two) times daily.   ferrous sulfate 325 (65 FE) MG tablet Take 1 tablet (325 mg total) by mouth 2 (two) times daily with a meal.       Diet: routine diet  Activity: Advance as tolerated. Pelvic rest for 6 weeks.   Outpatient follow up:6 weeks Follow up Appt:No future appointments. Follow up Visit:No Follow-up on file.  Postpartum contraception: Nexplanon  Newborn Data: Live born female  Birth Weight: 6 lb 15 oz (3147 g) APGAR: 7, 9  Baby Feeding: Breast Disposition:home with mother   06/26/2016 Renne Musca, MD   OB FELLOW DISCHARGE ATTESTATION  I have seen and examined this  patient and agree with above documentation in the resident's note.   Ernestina Penna, MD 10:56 AM

## 2016-06-26 NOTE — Discharge Instructions (Signed)

## 2017-03-29 ENCOUNTER — Encounter (HOSPITAL_COMMUNITY): Payer: Self-pay

## 2018-02-11 IMAGING — CR DG CHEST 2V
2 series · 2 of 2 positions shown · non-contrast
Comparison: None.

CLINICAL DATA: 19-year-old female with cold and cough

EXAM:
CHEST  2 VIEW

[chest pa]
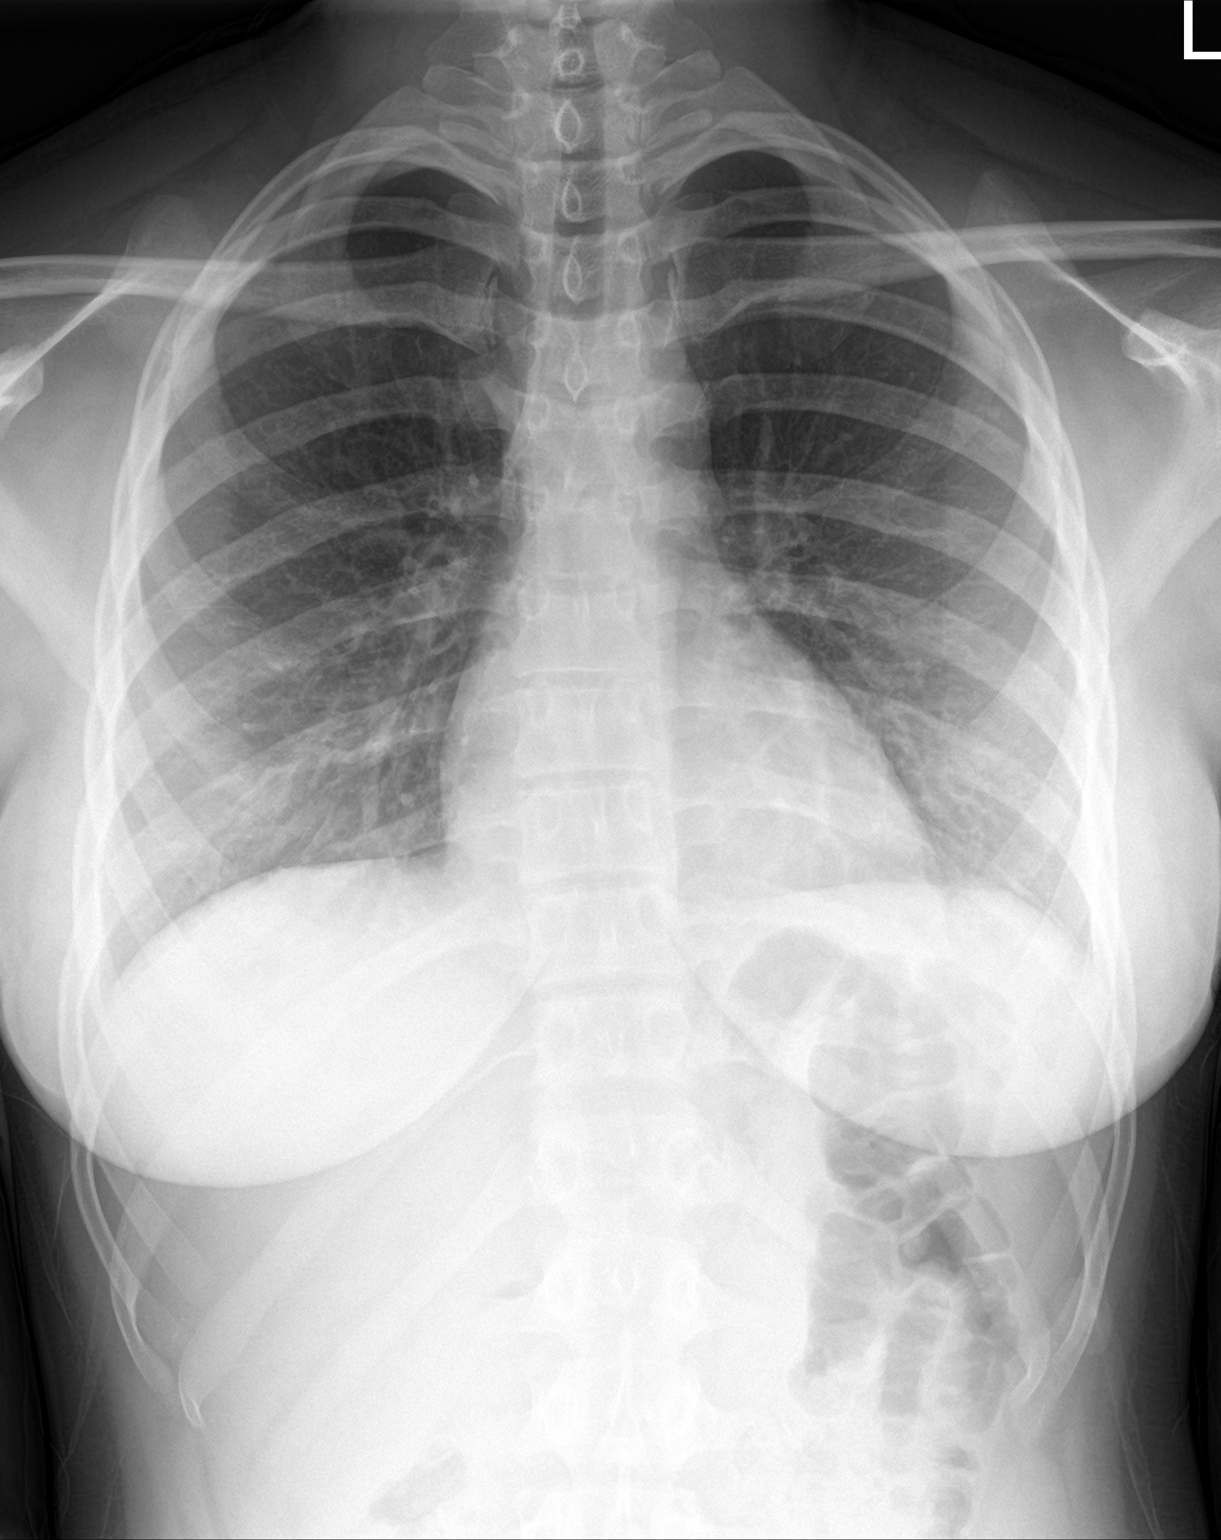

[chest lat]
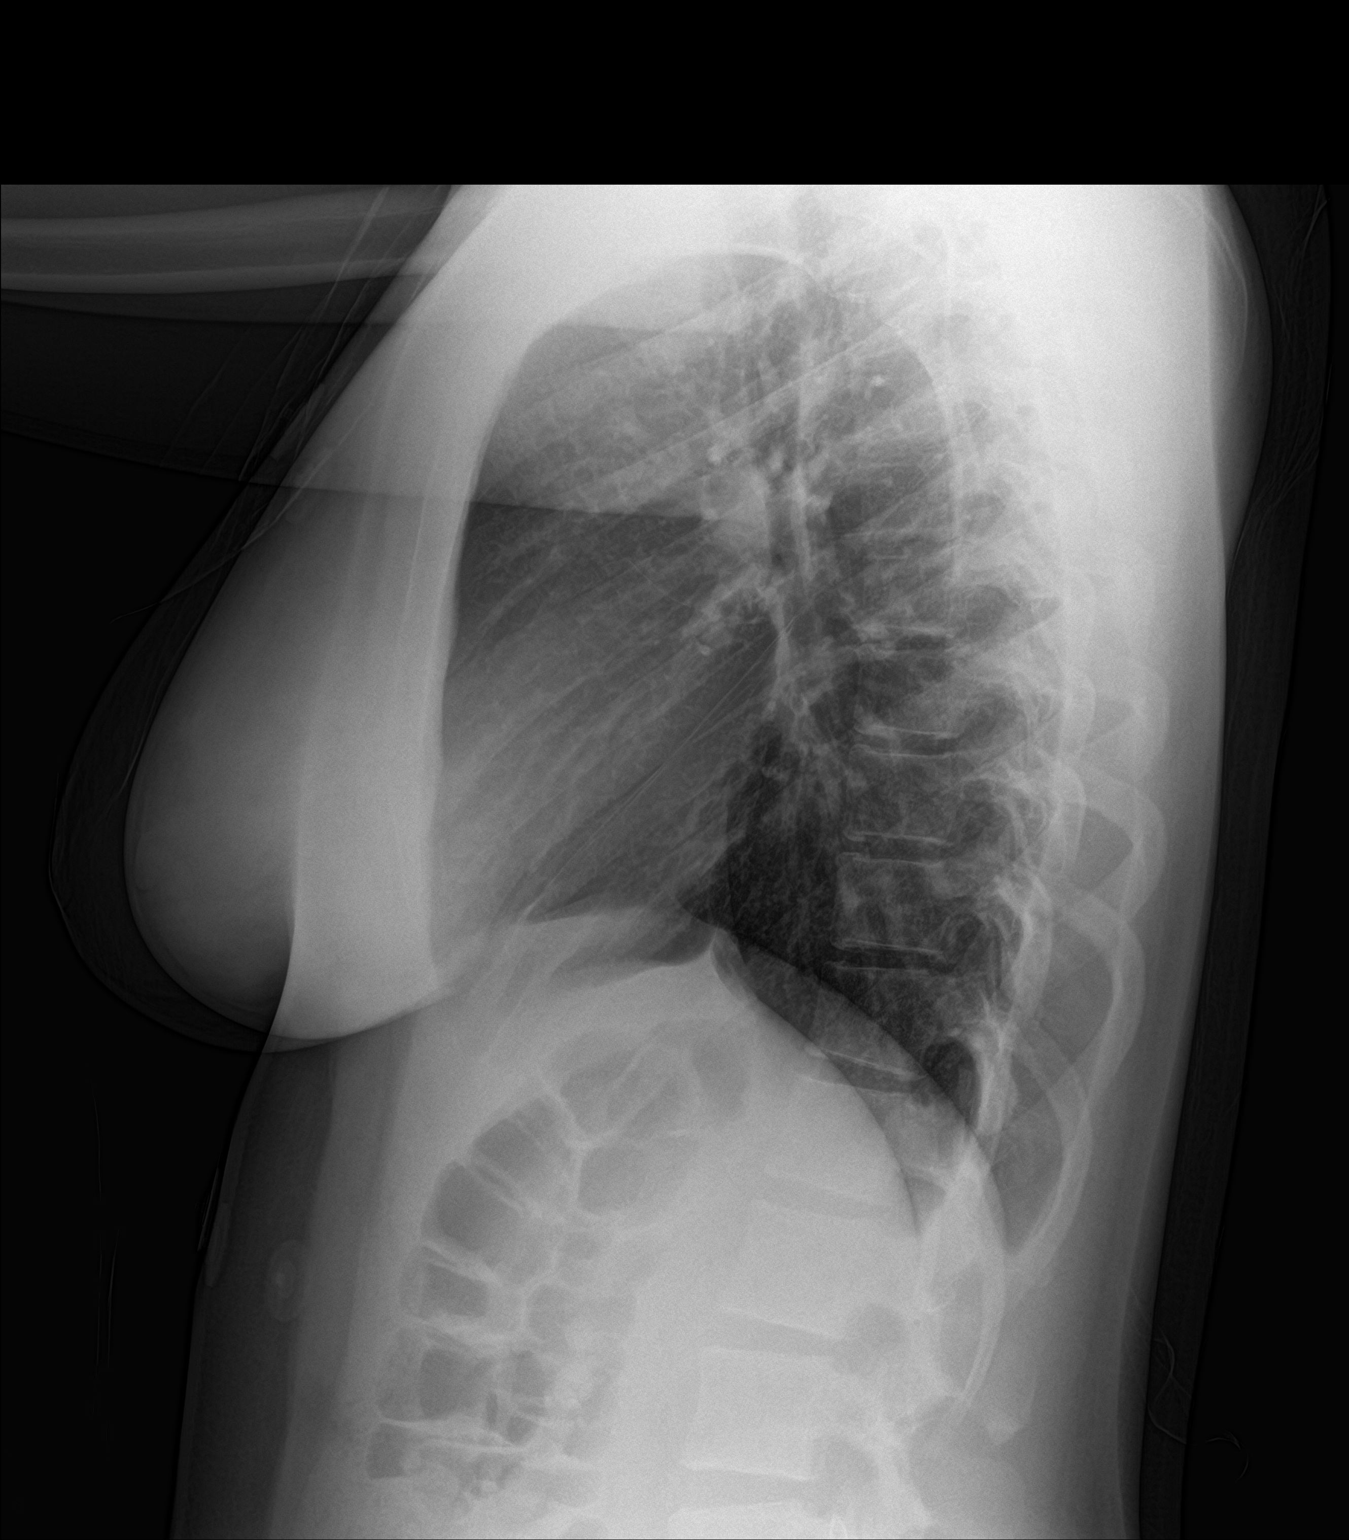

[2 of 2 positions shown; findings below may reference images not displayed]

FINDINGS: The heart size and mediastinal contours are within normal limits.
Both lungs are clear. The visualized skeletal structures are
unremarkable.
IMPRESSION: No active cardiopulmonary disease.

## 2019-08-17 ENCOUNTER — Telehealth: Payer: Self-pay | Admitting: Obstetrics and Gynecology

## 2019-08-17 NOTE — Telephone Encounter (Signed)
Tried to reach the patient to remind her of her appointment/restrictions, call can not be completed at this time. °

## 2019-08-18 ENCOUNTER — Encounter: Payer: Self-pay | Admitting: Obstetrics and Gynecology

## 2022-08-30 ENCOUNTER — Other Ambulatory Visit: Payer: Self-pay

## 2022-08-30 ENCOUNTER — Encounter (HOSPITAL_COMMUNITY): Payer: Self-pay

## 2022-08-30 ENCOUNTER — Emergency Department (HOSPITAL_COMMUNITY)
Admission: EM | Admit: 2022-08-30 | Discharge: 2022-08-30 | Disposition: A | Discharge status: Home / Self Care | Payer: Medicaid Other | Attending: Emergency Medicine | Admitting: Emergency Medicine

## 2022-08-30 DIAGNOSIS — Z3A25 25 weeks gestation of pregnancy: Secondary | ICD-10-CM

## 2022-08-30 DIAGNOSIS — R531 Weakness: Secondary | ICD-10-CM | POA: Diagnosis not present

## 2022-08-30 DIAGNOSIS — O26892 Other specified pregnancy related conditions, second trimester: Secondary | ICD-10-CM | POA: Diagnosis present

## 2022-08-30 LAB — CBC WITH DIFFERENTIAL/PLATELET
Abs Immature Granulocytes: 0.02 10*3/uL (ref 0.00–0.07)
Basophils Absolute: 0 10*3/uL (ref 0.0–0.1)
Basophils Relative: 0 %
Eosinophils Absolute: 0.1 10*3/uL (ref 0.0–0.5)
Eosinophils Relative: 2 %
HCT: 33.6 % — ABNORMAL LOW (ref 36.0–46.0)
Hemoglobin: 11.6 g/dL — ABNORMAL LOW (ref 12.0–15.0)
Immature Granulocytes: 0 %
Lymphocytes Relative: 25 %
Lymphs Abs: 1.8 10*3/uL (ref 0.7–4.0)
MCH: 27.4 pg (ref 26.0–34.0)
MCHC: 34.5 g/dL (ref 30.0–36.0)
MCV: 79.4 fL — ABNORMAL LOW (ref 80.0–100.0)
Monocytes Absolute: 0.4 10*3/uL (ref 0.1–1.0)
Monocytes Relative: 5 %
Neutro Abs: 4.7 10*3/uL (ref 1.7–7.7)
Neutrophils Relative %: 68 %
Platelets: 316 10*3/uL (ref 150–400)
RBC: 4.23 MIL/uL (ref 3.87–5.11)
RDW: 13.9 % (ref 11.5–15.5)
WBC: 7.1 10*3/uL (ref 4.0–10.5)
nRBC: 0 % (ref 0.0–0.2)

## 2022-08-30 LAB — COMPREHENSIVE METABOLIC PANEL
ALT: 10 U/L (ref 0–44)
AST: 16 U/L (ref 15–41)
Albumin: 2.8 g/dL — ABNORMAL LOW (ref 3.5–5.0)
Alkaline Phosphatase: 79 U/L (ref 38–126)
Anion gap: 7 (ref 5–15)
BUN: 9 mg/dL (ref 6–20)
CO2: 23 mmol/L (ref 22–32)
Calcium: 8.4 mg/dL — ABNORMAL LOW (ref 8.9–10.3)
Chloride: 103 mmol/L (ref 98–111)
Creatinine, Ser: 0.61 mg/dL (ref 0.44–1.00)
GFR, Estimated: >60 mL/min (ref >60)
Glucose, Bld: 84 mg/dL (ref 70–99)
Potassium: 3.6 mmol/L (ref 3.5–5.1)
Sodium: 133 mmol/L — ABNORMAL LOW (ref 135–145)
Total Bilirubin: 0.3 mg/dL (ref 0.3–1.2)
Total Protein: 7.2 g/dL (ref 6.5–8.1)

## 2022-08-30 LAB — URINALYSIS, ROUTINE W REFLEX MICROSCOPIC
Bilirubin Urine: NEGATIVE
Glucose, UA: NEGATIVE mg/dL
Hgb urine dipstick: NEGATIVE
Ketones, ur: NEGATIVE mg/dL
Leukocytes,Ua: NEGATIVE
Nitrite: NEGATIVE
Protein, ur: NEGATIVE mg/dL
Specific Gravity, Urine: 1.016 (ref 1.005–1.030)
pH: 7 (ref 5.0–8.0)

## 2022-08-30 LAB — CBG MONITORING, ED: Glucose-Capillary: 97 mg/dL (ref 70–99)

## 2022-08-30 LAB — LIPASE, BLOOD: Lipase: 27 U/L (ref 11–51)

## 2022-08-30 MED ORDER — SODIUM CHLORIDE 0.9 % IV BOLUS: 1000.0000 mL | Freq: Once | INTRAVENOUS | Status: DC

## 2022-08-30 NOTE — ED Triage Notes (Signed)
Pt reports she is [redacted] weeks pregnant. Pt states since 1100 this morning she has been feeling weak & having contractions. Doesn't know if it she sugar or what it is. Denies vaginal discharge, vomiting.  Pt is very agitated during triage stating "she doesn't have the energy to keep repeating herself." States she has had 2-3 contractions since 1100.

## 2022-08-30 NOTE — ED Provider Notes (Signed)
[facility] Provider Note   CSN: [id number] Arrival date & time: 08/30/22  1425     History  Chief Complaint  Patient presents with   Weakness    Andrea Oneill is a 27 y.o. female.  Pt is a 27 yo female with no significant pmhx.  She is G3P2 and is [redacted] weeks along.  She has been feeling weak this am.  She feels like she may be having some contractions.  She is worried her BS is elevated.  She has not yet her glucose tolerance test.  Pt denies f/c.  No vaginal bleeding or d/c.       Home Medications Prior to Admission medications   Medication Sig Start Date End Date Taking? Authorizing Provider  docusate sodium (COLACE) 100 MG capsule Take 1 capsule (100 mg total) by mouth 2 (two) times daily. 06/26/16   Eloise Levels, MD  ferrous sulfate 325 (65 FE) MG tablet Take 1 tablet (325 mg total) by mouth 2 (two) times daily with a meal. 06/26/16   Eloise Levels, MD      Allergies    Patient has no known allergies.    Review of Systems   Review of Systems  Neurological:  Positive for weakness.  All other systems reviewed and are negative.   Physical Exam Updated Vital Signs BP 136/77   Pulse 74   Temp 98.7 F (37.1 C) (Oral)   Resp 19   Ht 5\' 9"  (1.753 m)   Wt 115.2 kg   SpO2 100%   BMI 37.51 kg/m  Physical Exam Vitals and nursing note reviewed.  Constitutional:      Appearance: Normal appearance.  HENT:     Head: Normocephalic and atraumatic.     Right Ear: External ear normal.     Left Ear: External ear normal.     Nose: Nose normal.     Mouth/Throat:     Mouth: Mucous membranes are moist.     Pharynx: Oropharynx is clear.  Eyes:     Extraocular Movements: Extraocular movements intact.     Conjunctiva/sclera: Conjunctivae normal.     Pupils: Pupils are equal, round, and reactive to light.  Cardiovascular:     Rate and Rhythm: Normal rate and regular rhythm.     Pulses: Normal pulses.     Heart sounds:  Normal heart sounds.  Pulmonary:     Effort: Pulmonary effort is normal.     Breath sounds: Normal breath sounds.  Abdominal:     Comments: +gravid uterus  Musculoskeletal:        General: Normal range of motion.     Cervical back: Normal range of motion and neck supple.  Skin:    General: Skin is warm.     Capillary Refill: Capillary refill takes less than 2 seconds.  Neurological:     General: No focal deficit present.     Mental Status: She is alert and oriented to person, place, and time.  Psychiatric:        Mood and Affect: Mood normal.        Behavior: Behavior normal.     ED Results / Procedures / Treatments   Labs (all labs ordered are listed, but only abnormal results are displayed) Labs Reviewed  COMPREHENSIVE METABOLIC PANEL - Abnormal; Notable for the following components:      Result Value   Sodium 133 (*)    Calcium 8.4 (*)  Albumin 2.8 (*)    All other components within normal limits  CBC WITH DIFFERENTIAL/PLATELET - Abnormal; Notable for the following components:   Hemoglobin 11.6 (*)    HCT 33.6 (*)    MCV 79.4 (*)    All other components within normal limits  URINALYSIS, ROUTINE W REFLEX MICROSCOPIC  LIPASE, BLOOD  CBG MONITORING, ED    EKG EKG Interpretation  Date/Time:  Friday August 30 2022 15:33:29 EDT Ventricular Rate:  77 PR Interval:  147 QRS Duration: 79 QT Interval:  377 QTC Calculation: 427 R Axis:   47 Text Interpretation: Sinus rhythm No significant change since last tracing Confirmed by Isla Pence [phone]) on 08/30/2022 3:48:35 PM  Radiology No results found.  Procedures Procedures    Medications Ordered in ED Medications  sodium chloride 0.9 % bolus 1,000 mL (1,000 mLs Intravenous Not Given 08/30/22 1648)    ED Course/ Medical Decision Making/ A&P                             Medical Decision Making Amount and/or Complexity of Data Reviewed Labs: ordered.   This patient presents to the ED for concern of  weakness, this involves an extensive number of treatment options, and is a complaint that carries with it a high risk of complications and morbidity.  The differential diagnosis includes infection, preterm labor   Co morbidities that complicate the patient evaluation  none   Additional history obtained:  Additional history obtained from epic chart review   Lab Tests:  I Ordered, and personally interpreted labs.  The pertinent results include:  cbg 97, cbc with hgb 11.6 (hgb 12.1 on 05/04/22); cmp nl, ua nl, lip nl   Imaging Studies ordered:  I ordered imaging studies including bedside US  Good fetal mvmt; good fhts   Cardiac Monitoring:  The patient was maintained on a cardiac monitor.  I personally viewed and interpreted the cardiac monitored which showed an underlying rhythm of: nsr   Medicines ordered and prescription drug management:  I ordered medication including IVFs  for sx  Reevaluation of the patient after these medicines showed that the patient improved I have reviewed the patients home medicines and have made adjustments as needed  Critical Interventions:  Tocodynamometer   Consultations Obtained:  I requested consultation with [facility],  and discussed lab and imaging findings as well as pertinent plan - they recommend: discharge with outpatient f/u   Problem List / ED Course:  Weakness:  labs normal.  Pt looks well.  Likely due to pregnancy status. Pregnancy:  no evidence of contractions.  Pt works at [employer] and stands the entire shift.  She said they are allowed to sit if they have a note.  I have given her a note saying that she needs to sit periodically, but can continue to do her job.   Reevaluation:  After the interventions noted above, I reevaluated the patient and found that they have :improved   Social Determinants of Health:  Lives at home   Dispostion:  After consideration of the diagnostic results and the patients response to  treatment, I feel that the patent would benefit from discharge with outpatient f/u.          Final Clinical Impression(s) / ED Diagnoses Final diagnoses:  [redacted] weeks gestation of pregnancy    Rx / DC Orders ED Discharge Orders     None  Isla Pence, MD 08/30/22 [phone]

## 2022-08-30 NOTE — Progress Notes (Signed)
NST reactive and reassuring of 25 1/[redacted] weeks gestation.  Cleared by OB Service. Keep next appointment with [facility].  Call their office with questions and concerns.  Pearletha Forge RNC-OB RROB  [phone]

## 2022-08-30 NOTE — ED Notes (Signed)
Patient Alert and oriented to baseline. Stable and ambulatory to baseline. Patient verbalized understanding of the discharge instructions.  Patient belongings were taken by the patient.   

## 2022-10-15 ENCOUNTER — Inpatient Hospital Stay (HOSPITAL_COMMUNITY)
Admission: EM | Admit: 2022-10-15 | Discharge: 2022-10-15 | Disposition: A | Discharge status: Home / Self Care | Payer: Medicaid Other | Attending: Obstetrics & Gynecology | Admitting: Obstetrics & Gynecology

## 2022-10-15 ENCOUNTER — Encounter (HOSPITAL_COMMUNITY): Payer: Self-pay | Admitting: Obstetrics & Gynecology

## 2022-10-15 ENCOUNTER — Other Ambulatory Visit: Payer: Self-pay

## 2022-10-15 DIAGNOSIS — O23593 Infection of other part of genital tract in pregnancy, third trimester: Secondary | ICD-10-CM | POA: Diagnosis not present

## 2022-10-15 DIAGNOSIS — Z3A31 31 weeks gestation of pregnancy: Secondary | ICD-10-CM | POA: Diagnosis not present

## 2022-10-15 DIAGNOSIS — B9689 Other specified bacterial agents as the cause of diseases classified elsewhere: Secondary | ICD-10-CM | POA: Insufficient documentation

## 2022-10-15 DIAGNOSIS — O4703 False labor before 37 completed weeks of gestation, third trimester: Secondary | ICD-10-CM | POA: Diagnosis not present

## 2022-10-15 DIAGNOSIS — R109 Unspecified abdominal pain: Secondary | ICD-10-CM | POA: Insufficient documentation

## 2022-10-15 DIAGNOSIS — O26893 Other specified pregnancy related conditions, third trimester: Secondary | ICD-10-CM | POA: Diagnosis present

## 2022-10-15 LAB — COMPREHENSIVE METABOLIC PANEL
ALT: 15 U/L (ref 0–44)
AST: 20 U/L (ref 15–41)
Albumin: 2.5 g/dL — ABNORMAL LOW (ref 3.5–5.0)
Alkaline Phosphatase: 110 U/L (ref 38–126)
Anion gap: 6 (ref 5–15)
BUN: 8 mg/dL (ref 6–20)
CO2: 23 mmol/L (ref 22–32)
Calcium: 8.5 mg/dL — ABNORMAL LOW (ref 8.9–10.3)
Chloride: 106 mmol/L (ref 98–111)
Creatinine, Ser: 0.6 mg/dL (ref 0.44–1.00)
GFR, Estimated: >60 mL/min (ref >60)
Glucose, Bld: 108 mg/dL — ABNORMAL HIGH (ref 70–99)
Potassium: 3 mmol/L — ABNORMAL LOW (ref 3.5–5.1)
Sodium: 135 mmol/L (ref 135–145)
Total Bilirubin: 0.3 mg/dL (ref 0.3–1.2)
Total Protein: 6.8 g/dL (ref 6.5–8.1)

## 2022-10-15 LAB — PROTEIN / CREATININE RATIO, URINE
Creatinine, Urine: 204 mg/dL
Protein Creatinine Ratio: 0.08 mg/mg{Cre} (ref 0.00–0.15)
Total Protein, Urine: 16 mg/dL

## 2022-10-15 LAB — URINALYSIS, ROUTINE W REFLEX MICROSCOPIC
Bilirubin Urine: NEGATIVE
Glucose, UA: NEGATIVE mg/dL
Hgb urine dipstick: NEGATIVE
Ketones, ur: NEGATIVE mg/dL
Leukocytes,Ua: NEGATIVE
Nitrite: NEGATIVE
Protein, ur: NEGATIVE mg/dL
Specific Gravity, Urine: 1.012 (ref 1.005–1.030)
pH: 6 (ref 5.0–8.0)

## 2022-10-15 LAB — WET PREP, GENITAL
Clue Cells Wet Prep HPF POC: NONE SEEN
Sperm: NONE SEEN
Trich, Wet Prep: NONE SEEN
WBC, Wet Prep HPF POC: <10 (ref <10)
Yeast Wet Prep HPF POC: NONE SEEN

## 2022-10-15 LAB — ABO/RH: ABO/RH(D): A POS

## 2022-10-15 LAB — GC/CHLAMYDIA PROBE AMP (~~LOC~~) NOT AT ARMC
Chlamydia: NEGATIVE
Comment: NEGATIVE
Comment: NORMAL
Neisseria Gonorrhea: NEGATIVE

## 2022-10-15 MED ORDER — METRONIDAZOLE 500 MG PO TABS: 500.0000 mg | ORAL_TABLET | Freq: Two times a day (BID) | ORAL | 0 refills | Status: AC

## 2022-10-15 MED ORDER — LACTATED RINGERS IV BOLUS
1000.0000 mL | Freq: Once | INTRAVENOUS | Status: AC
  Administered 2022-10-15: 1000 mL via INTRAVENOUS

## 2022-10-15 MED ORDER — ACETAMINOPHEN 500 MG PO TABS
1000.0000 mg | ORAL_TABLET | Freq: Once | ORAL | Status: AC
  Administered 2022-10-15: 1000 mg via ORAL
  Filled 2022-10-15: qty 2

## 2022-10-15 NOTE — ED Provider Notes (Signed)
[facility] Provider Note   CSN: [id number] Arrival date & time: 10/15/22  0049     History  Chief Complaint  Patient presents with   Contractions    Andrea Oneill is a 27 y.o. female.  27 yo G3P1 at approximately 31.6 here with abdominal pain concerning for contractions. No LOF, vaginal d/c, urinary symptoms. Per patient has had an [place] for anatomy and location without known issues, can't view records from OSH. Sees ob at [place].         Home Medications Prior to Admission medications   Medication Sig Start Date End Date Taking? Authorizing Provider  metroNIDAZOLE (FLAGYL) 500 MG tablet Take 1 tablet (500 mg total) by mouth 2 (two) times daily. 10/15/22  Yes Gerrit Heck, CNM      Allergies    Patient has no known allergies.    Review of Systems   Review of Systems  Physical Exam Updated Vital Signs BP 137/77   Pulse 69   Temp 98 F (36.7 C) (Oral)   Resp 18   Ht 5\' 9"  (1.753 m)   Wt 120.2 kg   SpO2 99%   BMI 39.13 kg/m  Physical Exam Vitals and nursing note reviewed.  Constitutional:      Appearance: She is well-developed.  HENT:     Head: Normocephalic and atraumatic.     Mouth/Throat:     Mouth: Mucous membranes are moist.     Pharynx: Oropharynx is clear.  Cardiovascular:     Rate and Rhythm: Normal rate and regular rhythm.  Pulmonary:     Effort: No respiratory distress.     Breath sounds: No stridor.  Abdominal:     General: There is no distension.     Comments: Gravid abdomen  Musculoskeletal:        General: No swelling or tenderness. Normal range of motion.     Cervical back: Normal range of motion.  Skin:    General: Skin is warm and dry.  Neurological:     General: No focal deficit present.     Mental Status: She is alert.     ED Results / Procedures / Treatments   Labs (all labs ordered are listed, but only abnormal results are displayed) Labs Reviewed  COMPREHENSIVE METABOLIC PANEL - Abnormal; Notable for  the following components:      Result Value   Potassium 3.0 (*)    Glucose, Bld 108 (*)    Calcium 8.5 (*)    Albumin 2.5 (*)    All other components within normal limits  WET PREP, GENITAL  URINALYSIS, ROUTINE W REFLEX MICROSCOPIC  PROTEIN / CREATININE RATIO, URINE  CBC WITH DIFFERENTIAL/PLATELET  CBC WITH DIFFERENTIAL/PLATELET  ABO/RH  GC/CHLAMYDIA PROBE AMP ([place]) NOT AT [facility]    EKG None  Radiology No results found.  Procedures .Critical Care  Performed by: Marily Memos, MD Authorized by: Marily Memos, MD   Critical care provider statement:    Critical care time (minutes):  30   Critical care was necessary to treat or prevent imminent or life-threatening deterioration of the following conditions: possible acute labor.   Critical care was time spent personally by me on the following activities:  Development of treatment plan with patient or surrogate, discussions with consultants, evaluation of patient's response to treatment, examination of patient, ordering and review of laboratory studies, ordering and review of radiographic studies, ordering and performing treatments and interventions, pulse oximetry, re-evaluation of patient's condition and review  of old charts     Medications Ordered in ED Medications  lactated ringers bolus 1,000 mL (0 mLs Intravenous Stopped 10/15/22 0245)  acetaminophen (TYLENOL) tablet 1,000 mg (1,000 mg Oral Given 10/15/22 0132)    ED Course/ Medical Decision Making/ A&P                             Medical Decision Making Amount and/or Complexity of Data Reviewed Labs: ordered.  Risk OTC drugs.   Toco/FHR concerning for PTC per Ob report. Requesting transfer to MAU. [facility] at bedside soon after discussion for transport.   Final Clinical Impression(s) / ED Diagnoses Final diagnoses:  Preterm uterine contractions in third trimester, antepartum  Bacterial vaginosis  [redacted] weeks gestation of pregnancy    Rx / DC Orders ED  Discharge Orders          Ordered    Discharge patient        10/15/22 0502    metroNIDAZOLE (FLAGYL) 500 MG tablet  2 times daily        10/15/22 0502              Haydyn Girvan, Barbara Cower, MD 10/15/22 [phone]

## 2022-10-15 NOTE — MAU Provider Note (Signed)
History     CSN: [id number]  Arrival date and time: 10/15/22 0049   Event Date/Time   First Provider Initiated Contact with Patient 10/15/22 0341      Andrea Oneill is a 27 y.o. G3P1011 at [redacted]w[redacted]d who receives care at [facility] in [place].  She reports her next appt is May 21st.   She presents today for contractions.  She states the contractions started about 10 or 11 and was every 6-7 minutes.  She states the pain was a 7/10 and located t/o her stomach, but was generalized to her navel area. She reports she continues to have contractions, but they are not as frequent or intense. She denies recent sexual activity and vaginal discharge of concern.  She endorses fetal movement. She denies HA, visual disturbances, or RUQ pain.  She states she has had previously high blood pressure prior to and during pregnancy.  Review of previous delivery (2018) shows IOL for gHTN.  Review of [facility] prenatal records: yes Significant findings: Failed One hour, H/O Postpartum Hemorrhage, BMI 39  Medications: PNV   OB History     Gravida  2   Para      Term      Preterm      AB      Living         SAB      IAB      Ectopic      Multiple      Live Births              Past Medical History:  Diagnosis Date   Maternal varicella, non-immune     Past Surgical History:  Procedure Laterality Date   NO PAST SURGERIES      No family history on file.  Social History   Tobacco Use   Smoking status: Never   Smokeless tobacco: Never  Substance Use Topics   Alcohol use: No   Drug use: No    Allergies: No Known Allergies  Medications Prior to Admission  Medication Sig Dispense Refill Last Dose   docusate sodium (COLACE) 100 MG capsule Take 1 capsule (100 mg total) by mouth 2 (two) times daily. 30 capsule 0    ferrous sulfate 325 (65 FE) MG tablet Take 1 tablet (325 mg total) by mouth 2 (two) times daily with a meal. 60 tablet 3     Review of Systems  Eyes:  Negative for  visual disturbance.  Gastrointestinal:  Positive for abdominal pain. Negative for constipation, diarrhea, nausea and vomiting.  Genitourinary:  Negative for difficulty urinating, dysuria, vaginal bleeding and vaginal discharge.  Neurological:  Negative for dizziness, light-headedness and headaches.   Physical Exam   Blood pressure (!) 140/72, pulse 65, temperature 98 F (36.7 C), temperature source Oral, resp. rate 18, height 5\' 9"  (1.753 m), weight 120.2 kg, SpO2 99 %, unknown if currently breastfeeding.  Vitals:   10/15/22 0132 10/15/22 0140 10/15/22 0258 10/15/22 0305  BP: (!) 158/92 (!) 148/96 135/69 (!) 140/72      Physical Exam Vitals reviewed. Exam conducted with a chaperone present.  Constitutional:      General: She is not in acute distress.    Appearance: Normal appearance.  HENT:     Head: Normocephalic and atraumatic.  Eyes:     Conjunctiva/sclera: Conjunctivae normal.  Cardiovascular:     Rate and Rhythm: Normal rate.  Pulmonary:     Effort: Pulmonary effort is normal.  Abdominal:  General: Bowel sounds are normal.     Tenderness: There is no abdominal tenderness.     Comments: Gravid--fundal height appears AGA, Soft, NT   Genitourinary:    General: Normal vulva.     Comments: Speculum Exam: -Normal External Genitalia: Non tender, Small amt grayish white discharge at introitus. Malodor apparent -Vaginal Vault: Pink mucosa with good rugae. Scant amt grayish curdy white discharge noted -wet prep collected -Cervix:Pink, no lesions, cysts, or polyps.  Appears closed. No active bleeding from os-GC/CT collected -Bimanual Exam:  Closed/Thick Posterior Musculoskeletal:        General: Normal range of motion.     Cervical back: Normal range of motion.  Skin:    General: Skin is warm and dry.  Neurological:     Mental Status: She is alert and oriented to person, place, and time.  Psychiatric:        Mood and Affect: Mood normal.        Behavior: Behavior  normal.     Fetal Assessment 125 bpm, Mod Var, -Decels, +Accels Toco: One ctx and irritability initially graphed  MAU Course   Results for orders placed or performed during the hospital encounter of 10/15/22 (from the past 24 hour(s))  Comprehensive metabolic panel     Status: Abnormal   Collection Time: 10/15/22  1:15 AM  Result Value Ref Range   Sodium 135 135 - 145 mmol/L   Potassium 3.0 (L) 3.5 - 5.1 mmol/L   Chloride 106 98 - 111 mmol/L   CO2 23 22 - 32 mmol/L   Glucose, Bld 108 (H) 70 - 99 mg/dL   BUN 8 6 - 20 mg/dL   Creatinine, Ser 1.61 0.44 - 1.00 mg/dL   Calcium 8.5 (L) 8.9 - 10.3 mg/dL   Total Protein 6.8 6.5 - 8.1 g/dL   Albumin 2.5 (L) 3.5 - 5.0 g/dL   AST 20 15 - 41 U/L   ALT 15 0 - 44 U/L   Alkaline Phosphatase 110 38 - 126 U/L   Total Bilirubin 0.3 0.3 - 1.2 mg/dL   GFR, Estimated >09 >60 mL/min   Anion gap 6 5 - 15  ABO/Rh     Status: None   Collection Time: 10/15/22  1:15 AM  Result Value Ref Range   ABO/RH(D)      A POS Performed at [facility], [address], [place], [place] [address]   Urinalysis, Routine w reflex microscopic -Urine, Clean Catch     Status: None   Collection Time: 10/15/22  1:15 AM  Result Value Ref Range   Color, Urine YELLOW YELLOW   APPearance CLEAR CLEAR   Specific Gravity, Urine 1.012 1.005 - 1.030   pH 6.0 5.0 - 8.0   Glucose, UA NEGATIVE NEGATIVE mg/dL   Hgb urine dipstick NEGATIVE NEGATIVE   Bilirubin Urine NEGATIVE NEGATIVE   Ketones, ur NEGATIVE NEGATIVE mg/dL   Protein, ur NEGATIVE NEGATIVE mg/dL   Nitrite NEGATIVE NEGATIVE   Leukocytes,Ua NEGATIVE NEGATIVE  Wet prep, genital     Status: None   Collection Time: 10/15/22  4:22 AM  Result Value Ref Range   Yeast Wet Prep HPF POC NONE SEEN NONE SEEN   Trich, Wet Prep NONE SEEN NONE SEEN   Clue Cells Wet Prep HPF POC NONE SEEN NONE SEEN   WBC, Wet Prep HPF POC <10 <10   Sperm NONE SEEN   Protein / creatinine ratio, urine     Status: None   Collection Time:  10/15/22  5:06 AM  Result Value Ref Range   Creatinine, Urine 204 mg/dL   Total Protein, Urine 16 mg/dL   Protein Creatinine Ratio 0.08 0.00 - 0.15 mg/mg[Cre]   No results found.  MDM PE Labs: Drawn at [facility], Protein Creatine EFM Prescription Assessment and Plan  27 year old G3P1011  SIUP at 31.5 weeks Cat I FT Preterm Contractions Elevated BP in setting of CHTN vs GHTN  -POC Reviewed.  -Exam performed and findings discussed. -Cultures collected. -Informed that findings are suggestive of BV infection and will treat. -Patient educated on BV including what it is, treatment, and avoidance. -Reviewed treatment options. Desires oral medication. -BP elevated, but not severe. -CBC at [facility] pending, but will have nurse redraw. -PC Ratio to be collected. -Patient informed that if any abnormal findings, provider office will contact to discuss. -NST reactive. -Patient without pain. -Precautions reviewed. -Encouraged to call primary office or return to MAU if symptoms worsen or with the onset of new symptoms. -Discharged to home in stable condition.  Cherre Robins MSN, CNM 10/15/2022, 3:41 AM

## 2022-10-15 NOTE — ED Notes (Signed)
Pt left with [facility] 

## 2022-10-15 NOTE — Progress Notes (Signed)
Received call from [facility] for patient presenting "[redacted] weeks pregnant with second child and has been having contractions every 5 mins for the last hour. Reports she feels vaginal pressure. No leaking of fluid or vaginal bleeding. " Patient verbally denies any leaking of fluid or bleeding. Patient states she feels constant pelvic pressure and that she reported that to her doctor today at her appointment.   Per care everywhere, patient is a G3P1, receives care at [facility] and was seen in the office today.    [facility] staff placed patient on  monitor. This RN contacted Dr. Charlotta Newton and provided above report. Dr. Charlotta Newton advised patient is to come to MAU. This RN contacted Maci at [facility] and advised that patient needs to be transported to MAU, Maci verbalized understanding and stated she will advise ED provider.   0130 This RN contacted [facility] staff to request u/s monitor be adjusted as no FHR is showing on monitor. [facility] staff advised that [facility] just backed in and that the patient would be disconnected from the monitor soon. This RN requested that they get FHT before she is disconnected, [facility] staff verbalized understanding.   Lovenia Shuck, RN RROB

## 2022-10-15 NOTE — ED Notes (Signed)
[facility] notified of pts arrival

## 2022-10-15 NOTE — ED Triage Notes (Signed)
Pt states she is [redacted] weeks pregnant with second child and has been having contractions every 5 mins for the last hour. Reports she feels vaginal pressure. No leaking of fluid or vaginal bleeding.

## 2022-10-15 NOTE — MAU Note (Signed)
Pt presents from [facility] ED with c/o ctx q77mins. Arrived via EMS to MAU. Now feeling ctx less frequently. Denies VB and LOF. +FM. Last intercourse: 10/13/22. Pain: 7/10.

## 2022-11-19 ENCOUNTER — Emergency Department (HOSPITAL_COMMUNITY)
Admission: EM | Admit: 2022-11-19 | Discharge: 2022-11-19 | Disposition: A | Discharge status: Home / Self Care | Payer: Medicaid Other | Attending: Emergency Medicine | Admitting: Emergency Medicine

## 2022-11-19 DIAGNOSIS — Z3A37 37 weeks gestation of pregnancy: Secondary | ICD-10-CM | POA: Diagnosis not present

## 2022-11-19 DIAGNOSIS — Z3A36 36 weeks gestation of pregnancy: Secondary | ICD-10-CM | POA: Diagnosis not present

## 2022-11-19 DIAGNOSIS — O26893 Other specified pregnancy related conditions, third trimester: Secondary | ICD-10-CM | POA: Diagnosis present

## 2022-11-19 DIAGNOSIS — O133 Gestational [pregnancy-induced] hypertension without significant proteinuria, third trimester: Secondary | ICD-10-CM

## 2022-11-19 DIAGNOSIS — R1033 Periumbilical pain: Secondary | ICD-10-CM | POA: Insufficient documentation

## 2022-11-19 DIAGNOSIS — Z331 Pregnant state, incidental: Secondary | ICD-10-CM

## 2022-11-19 LAB — COMPREHENSIVE METABOLIC PANEL
ALT: 9 U/L (ref 0–44)
AST: 15 U/L (ref 15–41)
Albumin: 2.6 g/dL — ABNORMAL LOW (ref 3.5–5.0)
Alkaline Phosphatase: 130 U/L — ABNORMAL HIGH (ref 38–126)
Anion gap: 8 (ref 5–15)
BUN: 7 mg/dL (ref 6–20)
CO2: 21 mmol/L — ABNORMAL LOW (ref 22–32)
Calcium: 8.4 mg/dL — ABNORMAL LOW (ref 8.9–10.3)
Chloride: 103 mmol/L (ref 98–111)
Creatinine, Ser: 0.53 mg/dL (ref 0.44–1.00)
GFR, Estimated: >60 mL/min (ref >60)
Glucose, Bld: 72 mg/dL (ref 70–99)
Potassium: 3.4 mmol/L — ABNORMAL LOW (ref 3.5–5.1)
Sodium: 132 mmol/L — ABNORMAL LOW (ref 135–145)
Total Bilirubin: 0.5 mg/dL (ref 0.3–1.2)
Total Protein: 7.1 g/dL (ref 6.5–8.1)

## 2022-11-19 LAB — CBC WITH DIFFERENTIAL/PLATELET
Abs Immature Granulocytes: 0.02 10*3/uL (ref 0.00–0.07)
Basophils Absolute: 0 10*3/uL (ref 0.0–0.1)
Basophils Relative: 0 %
Eosinophils Absolute: 0.1 10*3/uL (ref 0.0–0.5)
Eosinophils Relative: 2 %
HCT: 31.5 % — ABNORMAL LOW (ref 36.0–46.0)
Hemoglobin: 10.8 g/dL — ABNORMAL LOW (ref 12.0–15.0)
Immature Granulocytes: 0 %
Lymphocytes Relative: 25 %
Lymphs Abs: 1.5 10*3/uL (ref 0.7–4.0)
MCH: 26.5 pg (ref 26.0–34.0)
MCHC: 34.3 g/dL (ref 30.0–36.0)
MCV: 77.4 fL — ABNORMAL LOW (ref 80.0–100.0)
Monocytes Absolute: 0.4 10*3/uL (ref 0.1–1.0)
Monocytes Relative: 6 %
Neutro Abs: 4.1 10*3/uL (ref 1.7–7.7)
Neutrophils Relative %: 67 %
Platelets: 304 10*3/uL (ref 150–400)
RBC: 4.07 MIL/uL (ref 3.87–5.11)
RDW: 14.4 % (ref 11.5–15.5)
WBC: 6 10*3/uL (ref 4.0–10.5)
nRBC: 0 % (ref 0.0–0.2)

## 2022-11-19 LAB — PROTEIN / CREATININE RATIO, URINE
Creatinine, Urine: 61 mg/dL
Total Protein, Urine: <6 mg/dL

## 2022-11-19 LAB — URINALYSIS, ROUTINE W REFLEX MICROSCOPIC
Bilirubin Urine: NEGATIVE
Glucose, UA: NEGATIVE mg/dL
Hgb urine dipstick: NEGATIVE
Ketones, ur: NEGATIVE mg/dL
Leukocytes,Ua: NEGATIVE
Nitrite: NEGATIVE
Protein, ur: NEGATIVE mg/dL
Specific Gravity, Urine: 1.006 (ref 1.005–1.030)
pH: 7 (ref 5.0–8.0)

## 2022-11-19 MED ORDER — LACTATED RINGERS IV BOLUS
1000.0000 mL | Freq: Once | INTRAVENOUS | Status: AC
  Administered 2022-11-19: 1000 mL via INTRAVENOUS

## 2022-11-19 NOTE — ED Triage Notes (Signed)
Pt. Came in complaining of abdominal pain that started around 12 pm. Pt. Is [redacted] weeks pregnant, second pregnancy. Pt. Cannot note how far apart the pains are just says "they are on and off" Pt. Notes no gush of fluids from her vaginal, but does notes some mucus, and does notes a small amount of blood yesterday. Pt. Receives prenatal care in [place], [place].

## 2022-11-19 NOTE — ED Provider Notes (Signed)
[place]rea [place] EMERGENCY DEPARTMENT AT [facility] Provider Note   CSN: [id number] Arrival date & time: 11/19/22  1418     History  Chief Complaint  Patient presents with   Abdominal Pain    37 week OB    Andrea Oneill is a 27 y.o. female.  HPI 27 year old female, G3, P1 presents with possible contractions.  She states she has been having tightness in her abdomen that comes and goes for a couple hours.  She did not really recognize it until around 2 PM because she was on the phone.  Seems to come about 10 seconds in total and then will come many minutes later.  Has not had 1 in 20+ minutes when I am talking to her.  No current vaginal loss of fluid or bleeding.  She states there was a little bit of blood in her mucus yesterday.  Baby has been moving appropriately.  Just had an ultrasound yesterday at [facility] She is about [redacted] weeks pregnant.   Home Medications Prior to Admission medications   Medication Sig Start Date End Date Taking? Authorizing Provider  metroNIDAZOLE (FLAGYL) 500 MG tablet Take 1 tablet (500 mg total) by mouth 2 (two) times daily. 10/15/22   Gerrit Heck, CNM      Allergies    Patient has no known allergies.    Review of Systems   Review of Systems  Gastrointestinal:  Positive for abdominal pain.    Physical Exam Updated Vital Signs BP (!) 142/82 (BP Location: Left Arm)   Pulse 71   Temp 98 F (36.7 C) (Oral)   Resp 17   Ht 5\' 10"  (1.778 m)   Wt 123.8 kg   SpO2 100%   BMI 39.17 kg/m  Physical Exam Vitals and nursing note reviewed. Exam conducted with a chaperone present.  Constitutional:      General: She is not in acute distress.    Appearance: She is well-developed. She is not ill-appearing or diaphoretic.  HENT:     Head: Normocephalic and atraumatic.  Cardiovascular:     Rate and Rhythm: Normal rate and regular rhythm.     Heart sounds: Normal heart sounds.  Pulmonary:     Effort: Pulmonary effort is normal.     Breath  sounds: Normal breath sounds.  Abdominal:     Palpations: Abdomen is soft.     Tenderness: There is abdominal tenderness in the periumbilical area.     Comments: gravid  Genitourinary:    Comments: Difficult exam, limited by patient discomfort. I could not clearly feel the cervix. No obvious fetal parts such as the head. No blood.  Skin:    General: Skin is warm and dry.  Neurological:     Mental Status: She is alert.     ED Results / Procedures / Treatments   Labs (all labs ordered are listed, but only abnormal results are displayed) Labs Reviewed  COMPREHENSIVE METABOLIC PANEL  CBC WITH DIFFERENTIAL/PLATELET  URINALYSIS, ROUTINE W REFLEX MICROSCOPIC  PROTEIN / CREATININE RATIO, URINE    EKG None  Radiology No results found.  Procedures Procedures    Medications Ordered in ED Medications  lactated ringers bolus 1,000 mL (has no administration in time range)    ED Course/ Medical Decision Making/ A&P                             Medical Decision Making Amount and/or Complexity of Data  Reviewed External Data Reviewed: radiology and notes. Labs: ordered.   Patient doesn't seem to have any significant contractions.  Has had little variability on the fetal heart tracing though did have an acceleration.  Discussed with Dr. Adrian Blackwater, will do workup including CBC, CMP, urine and urine protein/creatinine.  No clear evidence of imminent delivery. Care transferred to Dr. Estell Harpin.        Final Clinical Impression(s) / ED Diagnoses Final diagnoses:  None    Rx / DC Orders ED Discharge Orders     None         Pricilla Loveless, MD 11/19/22 1541

## 2022-11-19 NOTE — Discharge Instructions (Signed)
Contact your OB/GYN doctor tomorrow and let them know that your blood pressure was up and it was recommended by our obstetrician that you get induced at 37 weeks.  If you have any problems you should go directly to the hospital where you plan to get delivered.  Do not come back here we do not deliver babies or take care of pregnancies

## 2022-11-19 NOTE — Progress Notes (Signed)
FHT's reactive and reassuring for 36 5/7 weeks.  Labs WNL. Talked with Dr Estell Harpin.  Dr Manus Rudd reviewed strip, BP's, and labs with this RN and is recommending induction at 37 weeks for high blood pressures.  Conveyed this information to Dr Estell Harpin and told patient to get in touch with her OB.  Has no current appointment with her OB.  Cleared by OB Service.

## 2022-11-19 NOTE — ED Notes (Signed)
Pt placed onto toco monitor. Rapid response nurse called and informed of pt information. EDP assessed pt cervix. Rapid nurse aware of results. Rapid requested to monitor pt for awhile and get some labs and give fluids.

## 2022-11-19 NOTE — Progress Notes (Signed)
Patient ID: Andrea Oneill, female   DOB: February 14, 1996, 27 y.o.   MRN: [id number]  Called by RROB nurse regarding patient. Patient is [redacted]w[redacted]d [facility] 12/12/2022) presented to [facility] ED for high blood pressure. She was evaluated by her primary OB regarding this yesterday. Patient thinks she needs to be induced. No headache or vision changes reported.  Patient Vitals for the past 24 hrs:  BP Temp Temp src Pulse Resp SpO2 Height Weight  11/19/22 1630 (!) 150/91 -- -- -- -- -- -- --  11/19/22 1456 (!) 142/82 98 F (36.7 C) Oral 71 17 100 % 5\' 10"  (1.778 m) 123.8 kg   NST:  Baseline: 130  Variability: moderate Accelerations: present  Decelerations: none Contractions: q6 minutes  Results for orders placed or performed during the hospital encounter of 11/19/22 (from the past 24 hour(s))  Comprehensive metabolic panel     Status: Abnormal   Collection Time: 11/19/22  2:45 PM  Result Value Ref Range   Sodium 132 (L) 135 - 145 mmol/L   Potassium 3.4 (L) 3.5 - 5.1 mmol/L   Chloride 103 98 - 111 mmol/L   CO2 21 (L) 22 - 32 mmol/L   Glucose, Bld 72 70 - 99 mg/dL   BUN 7 6 - 20 mg/dL   Creatinine, Ser 7.82 0.44 - 1.00 mg/dL   Calcium 8.4 (L) 8.9 - 10.3 mg/dL   Total Protein 7.1 6.5 - 8.1 g/dL   Albumin 2.6 (L) 3.5 - 5.0 g/dL   AST 15 15 - 41 U/L   ALT 9 0 - 44 U/L   Alkaline Phosphatase 130 (H) 38 - 126 U/L   Total Bilirubin 0.5 0.3 - 1.2 mg/dL   GFR, Estimated >95 >62 mL/min   Anion gap 8 5 - 15  CBC with Differential     Status: Abnormal   Collection Time: 11/19/22  2:45 PM  Result Value Ref Range   WBC 6.0 4.0 - 10.5 K/uL   RBC 4.07 3.87 - 5.11 MIL/uL   Hemoglobin 10.8 (L) 12.0 - 15.0 g/dL   HCT 13.0 (L) 86.5 - 78.4 %   MCV 77.4 (L) 80.0 - 100.0 fL   MCH 26.5 26.0 - 34.0 pg   MCHC 34.3 30.0 - 36.0 g/dL   RDW 69.6 29.5 - 28.4 %   Platelets 304 150 - 400 K/uL   nRBC 0.0 0.0 - 0.2 %   Neutrophils Relative % 67 %   Neutro Abs 4.1 1.7 - 7.7 K/uL   Lymphocytes Relative 25 %   Lymphs  Abs 1.5 0.7 - 4.0 K/uL   Monocytes Relative 6 %   Monocytes Absolute 0.4 0.1 - 1.0 K/uL   Eosinophils Relative 2 %   Eosinophils Absolute 0.1 0.0 - 0.5 K/uL   Basophils Relative 0 %   Basophils Absolute 0.0 0.0 - 0.1 K/uL   Immature Granulocytes 0 %   Abs Immature Granulocytes 0.02 0.00 - 0.07 K/uL  Urinalysis, Routine w reflex microscopic -Urine, Clean Catch     Status: None   Collection Time: 11/19/22  3:57 PM  Result Value Ref Range   Color, Urine YELLOW YELLOW   APPearance CLEAR CLEAR   Specific Gravity, Urine 1.006 1.005 - 1.030   pH 7.0 5.0 - 8.0   Glucose, UA NEGATIVE NEGATIVE mg/dL   Hgb urine dipstick NEGATIVE NEGATIVE   Bilirubin Urine NEGATIVE NEGATIVE   Ketones, ur NEGATIVE NEGATIVE mg/dL   Protein, ur NEGATIVE NEGATIVE mg/dL   Nitrite NEGATIVE NEGATIVE  Leukocytes,Ua NEGATIVE NEGATIVE   Recommend induction at 37 weeks for gestational hypertension. She should contact her primary OB for induction. Return to care for severe headache not improved with tylenol, vision changes, severe epigastric or RUQ abdominal pain.  Levie Heritage, DO 11/19/2022 4:44 PM

## 2022-11-19 NOTE — ED Provider Notes (Signed)
Patient was discharged home and instructed to get in touch with her OB/GYN doctor tomorrow and let them know that it was a recommendation from our obstetrician that she get induced at 37 weeks for her high blood pressure.   Bethann Berkshire, MD 11/19/22 [phone]

## 2022-11-19 NOTE — ED Notes (Signed)
Talked with Carollee Herter in Rapid Response OB at [facility] and they will provide notes of their observations and recommendations for her.

## 2022-11-27 ENCOUNTER — Other Ambulatory Visit: Payer: Self-pay

## 2022-11-27 ENCOUNTER — Emergency Department (HOSPITAL_COMMUNITY)
Admission: EM | Admit: 2022-11-27 | Discharge: 2022-11-27 | Disposition: A | Discharge status: Home / Self Care | Payer: Medicaid Other | Attending: Emergency Medicine | Admitting: Emergency Medicine

## 2022-11-27 ENCOUNTER — Encounter (HOSPITAL_COMMUNITY): Payer: Self-pay

## 2022-11-27 DIAGNOSIS — O0001 Abdominal pregnancy with intrauterine pregnancy: Secondary | ICD-10-CM | POA: Insufficient documentation

## 2022-11-27 DIAGNOSIS — O0993 Supervision of high risk pregnancy, unspecified, third trimester: Secondary | ICD-10-CM | POA: Diagnosis not present

## 2022-11-27 DIAGNOSIS — Z3A38 38 weeks gestation of pregnancy: Secondary | ICD-10-CM | POA: Diagnosis not present

## 2022-11-27 DIAGNOSIS — Z331 Pregnant state, incidental: Secondary | ICD-10-CM

## 2022-11-27 DIAGNOSIS — Z3490 Encounter for supervision of normal pregnancy, unspecified, unspecified trimester: Secondary | ICD-10-CM

## 2022-11-27 LAB — CBC WITH DIFFERENTIAL/PLATELET
Abs Immature Granulocytes: 0.01 10*3/uL (ref 0.00–0.07)
Basophils Absolute: 0 10*3/uL (ref 0.0–0.1)
Basophils Relative: 0 %
Eosinophils Absolute: 0.1 10*3/uL (ref 0.0–0.5)
Eosinophils Relative: 2 %
HCT: 31.5 % — ABNORMAL LOW (ref 36.0–46.0)
Hemoglobin: 10.8 g/dL — ABNORMAL LOW (ref 12.0–15.0)
Immature Granulocytes: 0 %
Lymphocytes Relative: 26 %
Lymphs Abs: 1.8 10*3/uL (ref 0.7–4.0)
MCH: 26.5 pg (ref 26.0–34.0)
MCHC: 34.3 g/dL (ref 30.0–36.0)
MCV: 77.4 fL — ABNORMAL LOW (ref 80.0–100.0)
Monocytes Absolute: 0.5 10*3/uL (ref 0.1–1.0)
Monocytes Relative: 7 %
Neutro Abs: 4.5 10*3/uL (ref 1.7–7.7)
Neutrophils Relative %: 65 %
Platelets: 288 10*3/uL (ref 150–400)
RBC: 4.07 MIL/uL (ref 3.87–5.11)
RDW: 14.6 % (ref 11.5–15.5)
WBC: 7 10*3/uL (ref 4.0–10.5)
nRBC: 0 % (ref 0.0–0.2)

## 2022-11-27 LAB — BASIC METABOLIC PANEL
Anion gap: 8 (ref 5–15)
BUN: 5 mg/dL — ABNORMAL LOW (ref 6–20)
CO2: 22 mmol/L (ref 22–32)
Calcium: 8.4 mg/dL — ABNORMAL LOW (ref 8.9–10.3)
Chloride: 105 mmol/L (ref 98–111)
Creatinine, Ser: 0.54 mg/dL (ref 0.44–1.00)
GFR, Estimated: >60 mL/min (ref >60)
Glucose, Bld: 86 mg/dL (ref 70–99)
Potassium: 3.4 mmol/L — ABNORMAL LOW (ref 3.5–5.1)
Sodium: 135 mmol/L (ref 135–145)

## 2022-11-27 MED ORDER — SODIUM CHLORIDE 0.9 % IV BOLUS
500.0000 mL | Freq: Once | INTRAVENOUS | Status: AC
  Administered 2022-11-27: 500 mL via INTRAVENOUS

## 2022-11-27 NOTE — ED Notes (Signed)
Pt on toco machine, rapid nurse notified, EDP assessed pt cervix.

## 2022-11-27 NOTE — ED Notes (Signed)
Pt denies any headache  °

## 2022-11-27 NOTE — Progress Notes (Signed)
G3P1 at 37 6/7 weeks reports to [facility] with c/o cxns since 5pm today.  No leaking or bleeding noted.  SVE per ED MD is 1cm. Monitors applied per [facility] RN.  OBIX connected.  Marvell Fuller RNC-OB RROB  [phone]

## 2022-11-27 NOTE — Progress Notes (Signed)
Pt reports no severe features. OB cleared at this time.

## 2022-11-27 NOTE — ED Triage Notes (Signed)
Pt c/o abd pain that feels like they are contractions. Pt states she is [redacted] weeks Pregnant. Pt unsure of contraction time but states she has had 3 in 30 minutes.

## 2022-11-27 NOTE — Discharge Instructions (Signed)
Follow-up with your OB/GYN doctor °

## 2022-11-27 NOTE — Progress Notes (Signed)
Talked with Dr Mitzi Hansen regarding patient.  Patient is cleared by [facility] Service for labor.  Keep appointment with OB this week. Patients BP's are elevated.  If patient has any severe features, patient needs to come to MAU to be further evaluated.  [facility] RN will call back and let us know what further POC if needed.  Night shift RROB updated on patient status.  Marvell Fuller RNC-OB  RROB  [phone]

## 2022-11-29 NOTE — ED Provider Notes (Signed)
[place] EMERGENCY DEPARTMENT AT [facility] Provider Note   CSN: [id number] Arrival date & time: 11/27/22  1719     History  Chief Complaint  Patient presents with   Abdominal Pain    Andrea Oneill is a 27 y.o. female.  Patient was a term pregnancy and she started having lower abdominal cramping.  No bleeding  The history is provided by the patient and medical records. No language interpreter was used.  Abdominal Pain Pain location:  Generalized Pain quality: aching   Pain radiates to:  Does not radiate Pain severity:  Mild Onset quality:  Sudden Timing:  Intermittent Progression:  Waxing and waning Chronicity:  Recurrent Context: not alcohol use   Relieved by:  Nothing Worsened by:  Nothing Ineffective treatments:  None tried Associated symptoms: no chest pain, no cough, no diarrhea, no fatigue and no hematuria        Home Medications Prior to Admission medications   Medication Sig Start Date End Date Taking? Authorizing Provider  metroNIDAZOLE (FLAGYL) 500 MG tablet Take 1 tablet (500 mg total) by mouth 2 (two) times daily. 10/15/22   Gerrit Heck, CNM      Allergies    Patient has no known allergies.    Review of Systems   Review of Systems  Constitutional:  Negative for appetite change and fatigue.  HENT:  Negative for congestion, ear discharge and sinus pressure.   Eyes:  Negative for discharge.  Respiratory:  Negative for cough.   Cardiovascular:  Negative for chest pain.  Gastrointestinal:  Positive for abdominal pain. Negative for diarrhea.  Genitourinary:  Negative for frequency and hematuria.  Musculoskeletal:  Negative for back pain.  Skin:  Negative for rash.  Neurological:  Negative for seizures and headaches.  Psychiatric/Behavioral:  Negative for hallucinations.     Physical Exam Updated Vital Signs BP 138/72   Pulse 69   Temp 98.5 F (36.9 C) (Oral)   Resp (!) 22   Ht 5\' 10"  (1.778 m)   Wt 123.8 kg   SpO2 100%   BMI  39.17 kg/m  Physical Exam Vitals and nursing note reviewed.  Constitutional:      Appearance: She is well-developed.  HENT:     Head: Normocephalic.  Eyes:     General: No scleral icterus.    Conjunctiva/sclera: Conjunctivae normal.  Neck:     Thyroid: No thyromegaly.  Cardiovascular:     Rate and Rhythm: Normal rate and regular rhythm.     Heart sounds: No murmur heard.    No friction rub. No gallop.  Pulmonary:     Breath sounds: No stridor. No wheezing or rales.  Chest:     Chest wall: No tenderness.  Abdominal:     General: There is distension.     Tenderness: There is no abdominal tenderness. There is no rebound.     Comments: Abdomen consistent with term pregnancy  Genitourinary:    Comments: Bimanual exam done and the fetus is vertex with 0% effacement and 1 cm dilated Musculoskeletal:        General: Normal range of motion.     Cervical back: Neck supple.  Lymphadenopathy:     Cervical: No cervical adenopathy.  Skin:    Findings: No erythema or rash.  Neurological:     Mental Status: She is alert and oriented to person, place, and time.     Motor: No abnormal muscle tone.     Coordination: Coordination normal.  Psychiatric:  Behavior: Behavior normal.     ED Results / Procedures / Treatments   Labs (all labs ordered are listed, but only abnormal results are displayed) Labs Reviewed  BASIC METABOLIC PANEL - Abnormal; Notable for the following components:      Result Value   Potassium 3.4 (*)    BUN 5 (*)    Calcium 8.4 (*)    All other components within normal limits  CBC WITH DIFFERENTIAL/PLATELET - Abnormal; Notable for the following components:   Hemoglobin 10.8 (*)    HCT 31.5 (*)    MCV 77.4 (*)    All other components within normal limits    EKG None  Radiology No results found.  Procedures Procedures    Medications Ordered in ED Medications  sodium chloride 0.9 % bolus 500 mL (0 mLs Intravenous Stopped 11/27/22 2022)    ED  Course/ Medical Decision Making/ A&P                             Medical Decision Making Amount and/or Complexity of Data Reviewed Labs: ordered.   Term pregnancy with Braxton Hicks contractions.  Patient will follow-up with her OB/GYN as scheduled        Final Clinical Impression(s) / ED Diagnoses Final diagnoses:  IUP (intrauterine pregnancy), incidental    Rx / DC Orders ED Discharge Orders     None         Bethann Berkshire, MD 11/29/22 1241
